# Patient Record
Sex: Female | Born: 1960 | Race: White | Hispanic: No | Marital: Single | Smoking: Current every day smoker
Health system: Southern US, Community
[De-identification: ages and names within clinical notes are randomized; demographics above are authoritative.]

## PROBLEM LIST (undated history)

## (undated) DIAGNOSIS — R569 Unspecified convulsions: Secondary | ICD-10-CM

## (undated) DIAGNOSIS — I10 Essential (primary) hypertension: Secondary | ICD-10-CM

---

## 2019-10-12 ENCOUNTER — Other Ambulatory Visit: Payer: Self-pay

## 2019-10-12 ENCOUNTER — Emergency Department (HOSPITAL_COMMUNITY): Payer: Self-pay

## 2019-10-12 ENCOUNTER — Emergency Department (HOSPITAL_COMMUNITY)
Admission: EM | Admit: 2019-10-12 | Discharge: 2019-10-12 | Disposition: A | Discharge status: Home / Self Care | Payer: Self-pay | Attending: Emergency Medicine | Admitting: Emergency Medicine

## 2019-10-12 ENCOUNTER — Encounter (HOSPITAL_COMMUNITY): Payer: Self-pay

## 2019-10-12 DIAGNOSIS — F17210 Nicotine dependence, cigarettes, uncomplicated: Secondary | ICD-10-CM | POA: Insufficient documentation

## 2019-10-12 DIAGNOSIS — R072 Precordial pain: Secondary | ICD-10-CM | POA: Insufficient documentation

## 2019-10-12 DIAGNOSIS — R0602 Shortness of breath: Secondary | ICD-10-CM | POA: Insufficient documentation

## 2019-10-12 HISTORY — DX: Unspecified convulsions: R56.9

## 2019-10-12 LAB — CBC
HCT: 42.5 % (ref 36.0–46.0)
Hemoglobin: 14.3 g/dL (ref 12.0–15.0)
MCH: 31.1 pg (ref 26.0–34.0)
MCHC: 33.6 g/dL (ref 30.0–36.0)
MCV: 92.4 fL (ref 80.0–100.0)
Platelets: 225 10*3/uL (ref 150–400)
RBC: 4.6 MIL/uL (ref 3.87–5.11)
RDW: 12.7 % (ref 11.5–15.5)
WBC: 6.1 10*3/uL (ref 4.0–10.5)
nRBC: 0 % (ref 0.0–0.2)

## 2019-10-12 LAB — BASIC METABOLIC PANEL
Anion gap: 9 (ref 5–15)
BUN: 19 mg/dL (ref 6–20)
CO2: 25 mmol/L (ref 22–32)
Calcium: 9.6 mg/dL (ref 8.9–10.3)
Chloride: 107 mmol/L (ref 98–111)
Creatinine, Ser: 0.75 mg/dL (ref 0.44–1.00)
GFR calc Af Amer: >60 mL/min (ref >60)
GFR calc non Af Amer: >60 mL/min (ref >60)
Glucose, Bld: 103 mg/dL — ABNORMAL HIGH (ref 70–99)
Potassium: 4.1 mmol/L (ref 3.5–5.1)
Sodium: 141 mmol/L (ref 135–145)

## 2019-10-12 LAB — TROPONIN I (HIGH SENSITIVITY)
Troponin I (High Sensitivity): 3 ng/L (ref <18)
Troponin I (High Sensitivity): 3 ng/L (ref <18)

## 2019-10-12 MED ORDER — SODIUM CHLORIDE 0.9% FLUSH: 3.0000 mL | Freq: Once | INTRAVENOUS | Status: DC

## 2019-10-12 NOTE — ED Provider Notes (Signed)
[facility]-EMERGENCY DEPT Provider Note   CSN: [id number] Arrival date & time: 10/12/19  1743     History Chief Complaint  Patient presents with  . Chest Pain  . Shortness of Breath    Andrea Moses is a 59 y.o. female.  Patient with the onset of substernal chest pain at 5 PM.  With some radiation to left arm tingling in nature.  Resolved completely on its own at 8 PM.  Symptoms 1 present were associated with shortness of breath and the feeling of heart palpitations.  Patient's risk factors include positive tobacco use.  Although not a formal diagnosis of hypertension blood pressure is elevated here today.  No prior history of chest pain.  Any symptoms like this.  Patient's pain was constant from its onset at 5 PM.        Past Medical History:  Diagnosis Date  . Seizures (HCC)     There are no problems to display for this patient.   History reviewed. No pertinent surgical history.   OB History   No obstetric history on file.     Family History  Problem Relation Age of Onset  . Cerebral aneurysm Mother   . Hypertension Father     Social History   Tobacco Use  . Smoking status: Current Every Day Smoker    Packs/day: 0.50    Types: Cigarettes  . Smokeless tobacco: Never Used  Vaping Use  . Vaping Use: Never used  Substance Use Topics  . Alcohol use: Never  . Drug use: Never    Home Medications Prior to Admission medications   Not on File    Allergies    Bee venom  Review of Systems   Review of Systems  Constitutional: Negative for chills and fever.  HENT: Negative for congestion, rhinorrhea and sore throat.   Eyes: Negative for visual disturbance.  Respiratory: Positive for shortness of breath. Negative for cough.   Cardiovascular: Positive for chest pain. Negative for leg swelling.  Gastrointestinal: Negative for abdominal pain, diarrhea, nausea and vomiting.  Genitourinary: Negative for dysuria.  Musculoskeletal: Negative  for back pain and neck pain.  Skin: Negative for rash.  Neurological: Negative for dizziness, light-headedness and headaches.  Hematological: Does not bruise/bleed easily.  Psychiatric/Behavioral: Negative for confusion.    Physical Exam Updated Vital Signs BP (!) 192/95 (BP Location: Right Arm)   Pulse 92   Temp 98.1 F (36.7 C) (Oral)   Resp (!) 21   Ht 1.549 m (5\' 1" )   Wt 83.9 kg   SpO2 100%   BMI 34.96 kg/m   Physical Exam Vitals and nursing note reviewed.  Constitutional:      General: She is not in acute distress.    Appearance: Normal appearance. She is well-developed.  HENT:     Head: Normocephalic and atraumatic.  Eyes:     Extraocular Movements: Extraocular movements intact.     Conjunctiva/sclera: Conjunctivae normal.     Pupils: Pupils are equal, round, and reactive to light.  Cardiovascular:     Rate and Rhythm: Normal rate and regular rhythm.     Heart sounds: No murmur heard.   Pulmonary:     Effort: Pulmonary effort is normal. No respiratory distress.     Breath sounds: Normal breath sounds.  Abdominal:     Palpations: Abdomen is soft.     Tenderness: There is no abdominal tenderness.  Musculoskeletal:        General: No swelling.  Cervical back: Normal range of motion and neck supple.  Skin:    General: Skin is warm and dry.     Capillary Refill: Capillary refill takes less than 2 seconds.  Neurological:     General: No focal deficit present.     Mental Status: She is alert and oriented to person, place, and time.     Cranial Nerves: No cranial nerve deficit.     Sensory: No sensory deficit.     Motor: No weakness.     ED Results / Procedures / Treatments   Labs (all labs ordered are listed, but only abnormal results are displayed) Labs Reviewed  BASIC METABOLIC PANEL - Abnormal; Notable for the following components:      Result Value   Glucose, Bld 103 (*)    All other components within normal limits  CBC  D-DIMER, QUANTITATIVE  (NOT AT [facility])  TROPONIN I (HIGH SENSITIVITY)  TROPONIN I (HIGH SENSITIVITY)    EKG EKG Interpretation  Date/Time:  Saturday October 12 2019 17:51:37 EDT Ventricular Rate:  92 PR Interval:    QRS Duration: 99 QT Interval:  349 QTC Calculation: 432 R Axis:   74 Text Interpretation: Sinus rhythm Biatrial enlargement 12 Lead; Mason-Likar No previous ECGs available Confirmed by Fredia Sorrow [phone]) on 10/12/2019 8:48:12 PM   Radiology DG Chest 2 View  Result Date: 10/12/2019 CLINICAL DATA:  Chest pain.  Chest heaviness.  Shortness of breath. EXAM: CHEST - 2 VIEW COMPARISON:  None. FINDINGS: The cardiomediastinal contours are normal. Aortic atherosclerosis. Pulmonary vasculature is normal. No consolidation, pleural effusion, or pneumothorax. No acute osseous abnormalities are seen. IMPRESSION: No acute findings. Aortic Atherosclerosis (ICD10-I70.0). Electronically Signed   By: Keith Rake M.D.   On: 10/12/2019 18:23    Procedures Procedures (including critical care time)  Medications Ordered in ED Medications  sodium chloride flush (NS) 0.9 % injection 3 mL (has no administration in time range)    ED Course  I have reviewed the triage vital signs and the nursing notes.  Pertinent labs & imaging results that were available during my care of the patient were reviewed by me and considered in my medical decision making (see chart for details).    MDM Rules/Calculators/A&P                         Based on the heart pathway patient's heart score is 3.  Initial troponin normal.  Delta troponin will be done.  Patient now pain-free.  Chest x-ray without acute findings.  EKG without any acute findings.  Because of the complaint of the shortness of breath and the palpitations we will also check D-dimer.  Patient's renal function is normal.  If D-dimer elevated will do CT angio chest.  If no significant change in the second troponin patient probably can have close follow-up with  cardiology.  Ordered D-dimer but was never sent to the lab.  Patient does not want to wait for that.  Patient not tachycardic here not hypoxic.  Just was interested in doing it because of the history of palpitations.  Patient does not want it.  Patient clinically very stable.  Patient's delta troponins without any acute changes.  She will continue baby aspirin a day and follow-up with cardiology and return for any new or worse symptoms.  Final Clinical Impression(s) / ED Diagnoses Final diagnoses:  Precordial pain    Rx / DC Orders ED Discharge Orders    None  Vanetta Mulders, MD 10/12/19 [phone]

## 2019-10-12 NOTE — ED Triage Notes (Signed)
Patient ac/o heaviness of her chest and tingling of the left chest 20 minutes ago. Patient also c/o SOB

## 2019-10-12 NOTE — Discharge Instructions (Addendum)
Return for any new or worse chest pain symptoms.  Continue take a baby aspirin a day.  Make an appointment to follow-up with cardiology.  Today's work-up without evidence of any acute cardiac event.

## 2020-04-02 ENCOUNTER — Emergency Department (HOSPITAL_COMMUNITY)
Admission: EM | Admit: 2020-04-02 | Discharge: 2020-04-03 | Disposition: A | Discharge status: Home / Self Care | Payer: Self-pay | Attending: Emergency Medicine | Admitting: Emergency Medicine

## 2020-04-02 DIAGNOSIS — R202 Paresthesia of skin: Secondary | ICD-10-CM | POA: Insufficient documentation

## 2020-04-02 DIAGNOSIS — F17210 Nicotine dependence, cigarettes, uncomplicated: Secondary | ICD-10-CM | POA: Insufficient documentation

## 2020-04-02 DIAGNOSIS — M25512 Pain in left shoulder: Secondary | ICD-10-CM | POA: Insufficient documentation

## 2020-04-02 DIAGNOSIS — R55 Syncope and collapse: Secondary | ICD-10-CM | POA: Insufficient documentation

## 2020-04-02 DIAGNOSIS — R42 Dizziness and giddiness: Secondary | ICD-10-CM | POA: Insufficient documentation

## 2020-04-02 DIAGNOSIS — Z7982 Long term (current) use of aspirin: Secondary | ICD-10-CM | POA: Insufficient documentation

## 2020-04-02 DIAGNOSIS — R0602 Shortness of breath: Secondary | ICD-10-CM | POA: Insufficient documentation

## 2020-04-02 NOTE — ED Triage Notes (Signed)
Pt bib [facility] after witnessed syncopal episode at approx 2200. [facility] reports pt sat down at her work and started to feel dizzy and diaphoretic right before she passed out. Pt complaining of left sided chest pain, jaw pain, and left shoulder blade pain. A&Ox4 on arrival .

## 2020-04-03 ENCOUNTER — Emergency Department (HOSPITAL_COMMUNITY): Payer: Self-pay

## 2020-04-03 LAB — COMPREHENSIVE METABOLIC PANEL
ALT: 16 U/L (ref 0–44)
AST: 17 U/L (ref 15–41)
Albumin: 3.6 g/dL (ref 3.5–5.0)
Alkaline Phosphatase: 53 U/L (ref 38–126)
Anion gap: 9 (ref 5–15)
BUN: 25 mg/dL — ABNORMAL HIGH (ref 6–20)
CO2: 23 mmol/L (ref 22–32)
Calcium: 9.4 mg/dL (ref 8.9–10.3)
Chloride: 105 mmol/L (ref 98–111)
Creatinine, Ser: 1.03 mg/dL — ABNORMAL HIGH (ref 0.44–1.00)
GFR, Estimated: >60 mL/min (ref >60)
Glucose, Bld: 110 mg/dL — ABNORMAL HIGH (ref 70–99)
Potassium: 3.9 mmol/L (ref 3.5–5.1)
Sodium: 137 mmol/L (ref 135–145)
Total Bilirubin: 0.4 mg/dL (ref 0.3–1.2)
Total Protein: 6 g/dL — ABNORMAL LOW (ref 6.5–8.1)

## 2020-04-03 LAB — TROPONIN I (HIGH SENSITIVITY)
Troponin I (High Sensitivity): 3 ng/L (ref <18)
Troponin I (High Sensitivity): 4 ng/L (ref <18)

## 2020-04-03 LAB — CBC WITH DIFFERENTIAL/PLATELET
Abs Immature Granulocytes: 0.03 10*3/uL (ref 0.00–0.07)
Basophils Absolute: 0 10*3/uL (ref 0.0–0.1)
Basophils Relative: 0 %
Eosinophils Absolute: 0.1 10*3/uL (ref 0.0–0.5)
Eosinophils Relative: 1 %
HCT: 41.3 % (ref 36.0–46.0)
Hemoglobin: 13.4 g/dL (ref 12.0–15.0)
Immature Granulocytes: 0 %
Lymphocytes Relative: 13 %
Lymphs Abs: 1.4 10*3/uL (ref 0.7–4.0)
MCH: 30.7 pg (ref 26.0–34.0)
MCHC: 32.4 g/dL (ref 30.0–36.0)
MCV: 94.5 fL (ref 80.0–100.0)
Monocytes Absolute: 0.6 10*3/uL (ref 0.1–1.0)
Monocytes Relative: 6 %
Neutro Abs: 9 10*3/uL — ABNORMAL HIGH (ref 1.7–7.7)
Neutrophils Relative %: 80 %
Platelets: 223 10*3/uL (ref 150–400)
RBC: 4.37 MIL/uL (ref 3.87–5.11)
RDW: 12.4 % (ref 11.5–15.5)
WBC: 11.2 10*3/uL — ABNORMAL HIGH (ref 4.0–10.5)
nRBC: 0 % (ref 0.0–0.2)

## 2020-04-03 LAB — I-STAT BETA HCG BLOOD, ED (MC, WL, AP ONLY): I-stat hCG, quantitative: <5 m[IU]/mL (ref <5)

## 2020-04-03 LAB — D-DIMER, QUANTITATIVE: D-Dimer, Quant: 0.29 ug/mL-FEU (ref 0.00–0.50)

## 2020-04-03 LAB — MAGNESIUM: Magnesium: 1.8 mg/dL (ref 1.7–2.4)

## 2020-04-03 LAB — CBG MONITORING, ED: Glucose-Capillary: 112 mg/dL — ABNORMAL HIGH (ref 70–99)

## 2020-04-03 MED ORDER — ASPIRIN 81 MG PO CHEW
324.0000 mg | CHEWABLE_TABLET | Freq: Once | ORAL | Status: DC
  Filled 2020-04-03: qty 4

## 2020-04-03 NOTE — ED Provider Notes (Signed)
[facility] EMERGENCY DEPARTMENT Provider Note   CSN: [id number] Arrival date & time: 04/02/20  2351     History Chief Complaint  Patient presents with  . Loss of Consciousness    Andrea Moses is a 59 y.o. female.  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  She also has some tingling left side of her jaw and felt short of breath.  No nausea.  Mild left scapular pain at the same time.  Called EMS.  With them she "felt warm" but was not febrile.  EKG was relatively normal.  Brought here for further evaluation.  Questionable whether or not she actually syncopized.  At this time patient has mild dizziness but is otherwise asymptomatic.  No recent illnesses, fevers or cough.  No recent trauma.  Has a reported history of seizures. History of smoking. Seen here for similar episode (aside from near syncope) a few months ago with a normal workup. No follow up done.    Loss of Consciousness      Past Medical History:  Diagnosis Date  . Seizures (HCC)     There are no problems to display for this patient.   No past surgical history on file.   OB History   No obstetric history on file.     Family History  Problem Relation Age of Onset  . Cerebral aneurysm Mother   . Hypertension Father     Social History   Tobacco Use  . Smoking status: Current Every Day Smoker    Packs/day: 0.50    Types: Cigarettes  . Smokeless tobacco: Never Used  Vaping Use  . Vaping Use: Never used  Substance Use Topics  . Alcohol use: Never  . Drug use: Never    Home Medications Prior to Admission medications   Medication Sig Start Date End Date Taking? Authorizing Provider  aspirin 325 MG tablet Take 325 mg by mouth daily.   Yes [provider]  lisinopril (ZESTRIL) 20 MG tablet Take 20 mg by mouth daily.   Yes [provider]  Multiple  Vitamins-Minerals (ONE-A-DAY WOMENS PO) Take by mouth.   Yes [provider]    Allergies    Bee venom  Review of Systems   Review of Systems  Cardiovascular: Positive for syncope.  All other systems reviewed and are negative.   Physical Exam Updated Vital Signs BP 109/86   Pulse 86   Temp 97.8 F (36.6 C) (Oral)   Resp 19   Ht 5\' 1"  (1.549 m)   Wt 81.6 kg   SpO2 97%   BMI 34.01 kg/m   Physical Exam Vitals and nursing note reviewed.  Constitutional:      Appearance: She is well-developed.  HENT:     Head: Normocephalic and atraumatic.     Nose: No congestion or rhinorrhea.     Mouth/Throat:     Mouth: Mucous membranes are moist.  Eyes:     Pupils: Pupils are equal, round, and reactive to light.  Cardiovascular:     Rate and Rhythm: Normal rate and regular rhythm.  Pulmonary:     Effort: No respiratory distress.     Breath sounds: No stridor.  Abdominal:     General: There is no distension.  Musculoskeletal:        General: No swelling or tenderness. Normal range of motion.     Cervical back: Normal  range of motion.  Skin:    General: Skin is warm and dry.  Neurological:     General: No focal deficit present.     Mental Status: She is alert.     ED Results / Procedures / Treatments   Labs (all labs ordered are listed, but only abnormal results are displayed) Labs Reviewed  COMPREHENSIVE METABOLIC PANEL - Abnormal; Notable for the following components:      Result Value   Glucose, Bld 110 (*)    BUN 25 (*)    Creatinine, Ser 1.03 (*)    Total Protein 6.0 (*)    All other components within normal limits  CBC WITH DIFFERENTIAL/PLATELET - Abnormal; Notable for the following components:   WBC 11.2 (*)    Neutro Abs 9.0 (*)    All other components within normal limits  CBG MONITORING, ED - Abnormal; Notable for the following components:   Glucose-Capillary 112 (*)    All other components within normal limits  MAGNESIUM  D-DIMER, QUANTITATIVE  (NOT AT [facility])  I-STAT BETA HCG BLOOD, ED ([facility], [facility], [facility] ONLY)  TROPONIN I (HIGH SENSITIVITY)  TROPONIN I (HIGH SENSITIVITY)    EKG EKG Interpretation  Date/Time:  Friday April 03 2020 00:00:21 EST Ventricular Rate:  89 PR Interval:    QRS Duration: 101 QT Interval:  352 QTC Calculation: 429 R Axis:   93 Text Interpretation: Sinus rhythm Right atrial enlargement Borderline right axis deviation Confirmed by Marily Memos [phone]) on 04/03/2020 2:07:15 AM   Radiology DG Chest Portable 1 View  Result Date: 04/03/2020 CLINICAL DATA:  Recent syncopal episode EXAM: PORTABLE CHEST 1 VIEW COMPARISON:  None. FINDINGS: Cardiac shadow is within normal limits. Aortic calcifications are noted. The lungs are well aerated without focal infiltrate or sizable effusion. No bony abnormality is noted. IMPRESSION: No acute abnormality noted. Electronically Signed   By: Alcide Clever M.D.   On: 04/03/2020 00:24    Procedures Procedures (including critical care time)  Medications Ordered in ED Medications  aspirin chewable tablet 324 mg (324 mg Oral Not Given 04/03/20 0044)    ED Course  I have reviewed the triage vital signs and the nursing notes.  Pertinent labs & imaging results that were available during my care of the patient were reviewed by me and considered in my medical decision making (see chart for details).    MDM Rules/Calculators/A&P                          eval for ACS/near syncopal episode which are largerly similar.   Work-up unremarkable.  Low suspicion for ACS at this time.  D-dimer negative low suspicion for PE.  Her symptoms are resolved.  Seems consistent with possible vasovagal episode.  She reiterates that she did not pass out.  I discussed that she has some chest pain previously and this time she had some mild discomfort in her shoulder blade and near syncopal episode I suggest that she follow-up with cardiology.  She states that she is from [place] does not have a PCP in  this area so referral for outpatient cardiology was put in.  Final Clinical Impression(s) / ED Diagnoses Final diagnoses:  Near syncope    Rx / DC Orders ED Discharge Orders         Ordered    Ambulatory referral to Cardiology        04/03/20 0310           Cordarrel Stiefel,  Barbara Cower, MD 04/03/20 [phone]

## 2020-04-13 ENCOUNTER — Encounter: Payer: Self-pay | Admitting: General Practice

## 2021-06-19 ENCOUNTER — Emergency Department (HOSPITAL_COMMUNITY): Payer: Self-pay

## 2021-06-19 ENCOUNTER — Other Ambulatory Visit: Payer: Self-pay

## 2021-06-19 ENCOUNTER — Observation Stay (HOSPITAL_COMMUNITY)
Admission: EM | Admit: 2021-06-19 | Discharge: 2021-06-19 | Disposition: A | Discharge status: Home / Self Care | Payer: Self-pay | Attending: Family Medicine | Admitting: Family Medicine

## 2021-06-19 ENCOUNTER — Observation Stay (HOSPITAL_BASED_OUTPATIENT_CLINIC_OR_DEPARTMENT_OTHER): Payer: Self-pay

## 2021-06-19 ENCOUNTER — Encounter (HOSPITAL_COMMUNITY): Payer: Self-pay | Admitting: Emergency Medicine

## 2021-06-19 ENCOUNTER — Observation Stay (HOSPITAL_COMMUNITY): Payer: Self-pay

## 2021-06-19 DIAGNOSIS — H34231 Retinal artery branch occlusion, right eye: Secondary | ICD-10-CM

## 2021-06-19 DIAGNOSIS — H539 Unspecified visual disturbance: Secondary | ICD-10-CM

## 2021-06-19 DIAGNOSIS — F17210 Nicotine dependence, cigarettes, uncomplicated: Secondary | ICD-10-CM | POA: Insufficient documentation

## 2021-06-19 DIAGNOSIS — Z79899 Other long term (current) drug therapy: Secondary | ICD-10-CM | POA: Insufficient documentation

## 2021-06-19 DIAGNOSIS — M316 Other giant cell arteritis: Secondary | ICD-10-CM

## 2021-06-19 DIAGNOSIS — Z7982 Long term (current) use of aspirin: Secondary | ICD-10-CM | POA: Insufficient documentation

## 2021-06-19 DIAGNOSIS — Z20822 Contact with and (suspected) exposure to covid-19: Secondary | ICD-10-CM | POA: Insufficient documentation

## 2021-06-19 DIAGNOSIS — G459 Transient cerebral ischemic attack, unspecified: Secondary | ICD-10-CM

## 2021-06-19 DIAGNOSIS — I10 Essential (primary) hypertension: Secondary | ICD-10-CM | POA: Diagnosis present

## 2021-06-19 HISTORY — DX: Essential (primary) hypertension: I10

## 2021-06-19 LAB — URINALYSIS, ROUTINE W REFLEX MICROSCOPIC
Bacteria, UA: NONE SEEN
Bilirubin Urine: NEGATIVE
Glucose, UA: NEGATIVE mg/dL
Hgb urine dipstick: NEGATIVE
Ketones, ur: NEGATIVE mg/dL
Nitrite: NEGATIVE
Protein, ur: NEGATIVE mg/dL
Specific Gravity, Urine: 1.016 (ref 1.005–1.030)
pH: 7 (ref 5.0–8.0)

## 2021-06-19 LAB — ECHOCARDIOGRAM COMPLETE
AR max vel: 1.73 cm2
AV Area VTI: 1.87 cm2
AV Area mean vel: 1.71 cm2
AV Mean grad: 4 mmHg
AV Peak grad: 7 mmHg
Ao pk vel: 1.32 m/s
Area-P 1/2: 3.48 cm2
Calc EF: 59.9 %
S' Lateral: 2.3 cm
Single Plane A2C EF: 68.5 %
Single Plane A4C EF: 53.5 %

## 2021-06-19 LAB — COMPREHENSIVE METABOLIC PANEL
ALT: 16 U/L (ref 0–44)
AST: 19 U/L (ref 15–41)
Albumin: 3.8 g/dL (ref 3.5–5.0)
Alkaline Phosphatase: 55 U/L (ref 38–126)
Anion gap: 8 (ref 5–15)
BUN: 12 mg/dL (ref 8–23)
CO2: 26 mmol/L (ref 22–32)
Calcium: 9.5 mg/dL (ref 8.9–10.3)
Chloride: 106 mmol/L (ref 98–111)
Creatinine, Ser: 0.78 mg/dL (ref 0.44–1.00)
GFR, Estimated: >60 mL/min (ref >60)
Glucose, Bld: 89 mg/dL (ref 70–99)
Potassium: 4.1 mmol/L (ref 3.5–5.1)
Sodium: 140 mmol/L (ref 135–145)
Total Bilirubin: 0.3 mg/dL (ref 0.3–1.2)
Total Protein: 6.2 g/dL — ABNORMAL LOW (ref 6.5–8.1)

## 2021-06-19 LAB — CBC
HCT: 39.5 % (ref 36.0–46.0)
HCT: 36.5 % (ref 36.0–46.0)
Hemoglobin: 13.3 g/dL (ref 12.0–15.0)
Hemoglobin: 12.3 g/dL (ref 12.0–15.0)
MCH: 32 pg (ref 26.0–34.0)
MCH: 31.8 pg (ref 26.0–34.0)
MCHC: 33.7 g/dL (ref 30.0–36.0)
MCHC: 33.7 g/dL (ref 30.0–36.0)
MCV: 95 fL (ref 80.0–100.0)
MCV: 94.3 fL (ref 80.0–100.0)
Platelets: 204 10*3/uL (ref 150–400)
Platelets: 191 10*3/uL (ref 150–400)
RBC: 4.16 MIL/uL (ref 3.87–5.11)
RBC: 3.87 MIL/uL (ref 3.87–5.11)
RDW: 12.3 % (ref 11.5–15.5)
RDW: 12.3 % (ref 11.5–15.5)
WBC: 6.1 10*3/uL (ref 4.0–10.5)
WBC: 5.2 10*3/uL (ref 4.0–10.5)
nRBC: 0 % (ref 0.0–0.2)
nRBC: 0 % (ref 0.0–0.2)

## 2021-06-19 LAB — RAPID URINE DRUG SCREEN, HOSP PERFORMED
Amphetamines: NOT DETECTED
Barbiturates: NOT DETECTED
Benzodiazepines: NOT DETECTED
Cocaine: NOT DETECTED
Opiates: NOT DETECTED
Tetrahydrocannabinol: NOT DETECTED

## 2021-06-19 LAB — I-STAT CHEM 8, ED
BUN: 13 mg/dL (ref 8–23)
Calcium, Ion: 1.26 mmol/L (ref 1.15–1.40)
Chloride: 106 mmol/L (ref 98–111)
Creatinine, Ser: 0.7 mg/dL (ref 0.44–1.00)
Glucose, Bld: 83 mg/dL (ref 70–99)
HCT: 39 % (ref 36.0–46.0)
Hemoglobin: 13.3 g/dL (ref 12.0–15.0)
Potassium: 4.2 mmol/L (ref 3.5–5.1)
Sodium: 143 mmol/L (ref 135–145)
TCO2: 29 mmol/L (ref 22–32)

## 2021-06-19 LAB — PROTIME-INR
INR: 0.9 (ref 0.8–1.2)
Prothrombin Time: 12.1 seconds (ref 11.4–15.2)

## 2021-06-19 LAB — DIFFERENTIAL
Abs Immature Granulocytes: 0.01 10*3/uL (ref 0.00–0.07)
Basophils Absolute: 0 10*3/uL (ref 0.0–0.1)
Basophils Relative: 0 %
Eosinophils Absolute: 0.2 10*3/uL (ref 0.0–0.5)
Eosinophils Relative: 3 %
Immature Granulocytes: 0 %
Lymphocytes Relative: 39 %
Lymphs Abs: 2.4 10*3/uL (ref 0.7–4.0)
Monocytes Absolute: 0.4 10*3/uL (ref 0.1–1.0)
Monocytes Relative: 7 %
Neutro Abs: 3.1 10*3/uL (ref 1.7–7.7)
Neutrophils Relative %: 51 %

## 2021-06-19 LAB — LIPID PANEL
Cholesterol: 226 mg/dL — ABNORMAL HIGH (ref 0–200)
HDL: 55 mg/dL (ref >40)
LDL Cholesterol: 161 mg/dL — ABNORMAL HIGH (ref 0–99)
Total CHOL/HDL Ratio: 4.1 RATIO
Triglycerides: 49 mg/dL (ref <150)
VLDL: 10 mg/dL (ref 0–40)

## 2021-06-19 LAB — RESP PANEL BY RT-PCR (FLU A&B, COVID) ARPGX2
Influenza A by PCR: NEGATIVE
Influenza B by PCR: NEGATIVE
SARS Coronavirus 2 by RT PCR: NEGATIVE

## 2021-06-19 LAB — CBG MONITORING, ED: Glucose-Capillary: 91 mg/dL (ref 70–99)

## 2021-06-19 LAB — HIV ANTIBODY (ROUTINE TESTING W REFLEX): HIV Screen 4th Generation wRfx: NONREACTIVE

## 2021-06-19 LAB — APTT: aPTT: 33 seconds (ref 24–36)

## 2021-06-19 LAB — ETHANOL: Alcohol, Ethyl (B): <10 mg/dL (ref <10)

## 2021-06-19 LAB — CREATININE, SERUM
Creatinine, Ser: 0.77 mg/dL (ref 0.44–1.00)
GFR, Estimated: >60 mL/min (ref >60)

## 2021-06-19 LAB — HEMOGLOBIN A1C
Hgb A1c MFr Bld: 4.8 % (ref 4.8–5.6)
Mean Plasma Glucose: 91.06 mg/dL

## 2021-06-19 LAB — C-REACTIVE PROTEIN: CRP: 0.6 mg/dL (ref <1.0)

## 2021-06-19 LAB — SEDIMENTATION RATE: Sed Rate: 10 mm/hr (ref 0–22)

## 2021-06-19 MED ORDER — ASPIRIN 81 MG PO CHEW
81.0000 mg | CHEWABLE_TABLET | Freq: Every day | ORAL | Status: DC
  Administered 2021-06-19: 81 mg via ORAL
  Filled 2021-06-19: qty 1

## 2021-06-19 MED ORDER — ACETAMINOPHEN 160 MG/5ML PO SOLN: 650.0000 mg | ORAL | Status: DC | PRN

## 2021-06-19 MED ORDER — ASPIRIN 81 MG PO TBEC: 81.0000 mg | DELAYED_RELEASE_TABLET | Freq: Every day | ORAL | 0 refills | Status: AC

## 2021-06-19 MED ORDER — STROKE: EARLY STAGES OF RECOVERY BOOK
Freq: Once | Status: AC
  Filled 2021-06-19: qty 1

## 2021-06-19 MED ORDER — ATORVASTATIN CALCIUM 80 MG PO TABS
80.0000 mg | ORAL_TABLET | Freq: Every day | ORAL | 1 refills | Status: AC
Start: 2021-06-20 — End: ?

## 2021-06-19 MED ORDER — CLOPIDOGREL BISULFATE 75 MG PO TABS: 75.0000 mg | ORAL_TABLET | Freq: Every day | ORAL | 1 refills | Status: AC

## 2021-06-19 MED ORDER — CLOPIDOGREL BISULFATE 75 MG PO TABS
75.0000 mg | ORAL_TABLET | Freq: Every day | ORAL | Status: DC
Start: 2021-06-19 — End: 2021-06-19
  Administered 2021-06-19: 75 mg via ORAL
  Filled 2021-06-19: qty 1

## 2021-06-19 MED ORDER — SODIUM CHLORIDE 0.9 % IV SOLN: INTRAVENOUS | Status: DC

## 2021-06-19 MED ORDER — HYDRALAZINE HCL 20 MG/ML IJ SOLN
10.0000 mg | INTRAMUSCULAR | Status: DC | PRN
Start: 2021-06-19 — End: 2021-06-19

## 2021-06-19 MED ORDER — ATORVASTATIN CALCIUM 80 MG PO TABS
80.0000 mg | ORAL_TABLET | Freq: Every day | ORAL | Status: DC
  Administered 2021-06-19: 80 mg via ORAL
  Filled 2021-06-19: qty 1

## 2021-06-19 MED ORDER — ACETAMINOPHEN 325 MG PO TABS: 650.0000 mg | ORAL_TABLET | ORAL | Status: DC | PRN

## 2021-06-19 MED ORDER — ENOXAPARIN SODIUM 40 MG/0.4ML IJ SOSY
40.0000 mg | PREFILLED_SYRINGE | INTRAMUSCULAR | Status: DC
  Administered 2021-06-19: 40 mg via SUBCUTANEOUS
  Filled 2021-06-19: qty 0.4

## 2021-06-19 MED ORDER — ACETAMINOPHEN 650 MG RE SUPP: 650.0000 mg | RECTAL | Status: DC | PRN

## 2021-06-19 MED ORDER — IOHEXOL 350 MG/ML SOLN
75.0000 mL | Freq: Once | INTRAVENOUS | Status: AC | PRN
  Administered 2021-06-19: 75 mL via INTRAVENOUS

## 2021-06-19 NOTE — ED Notes (Signed)
Neuro MD at BS

## 2021-06-19 NOTE — Progress Notes (Incomplete)
° °  Echocardiogram 2D Echocardiogram has been performed.  [name] 06/19/2021, 12:30 PM

## 2021-06-19 NOTE — Progress Notes (Signed)
° °  Echocardiogram 2D Echocardiogram has been performed.  [name] 06/19/2021, 12:31 PM

## 2021-06-19 NOTE — ED Triage Notes (Addendum)
Patient with headache, nausea and vomiting.  Patient started with numbness on left side.  Patient states she lost vision on the right side, but it is back now. Patient has equal hand grips, weak pedal pushes.  No slurred speech.  Patient does not feel right.  LSN:  [id number] 2/17

## 2021-06-19 NOTE — Code Documentation (Signed)
Responded to Code Stroke called at [facility] on pt already in ED for vision changes, [id number]. NIH-1 for R quadrantanopia, CT head neg for acute changes. TNK not given-risks outweigh benefits per pt. Plan to admit for stroke work-up.

## 2021-06-19 NOTE — ED Notes (Addendum)
Ambulatory to  b/r, steady gait. PT here to see.

## 2021-06-19 NOTE — H&P (Signed)
History and Physical    Andrea Moses [id number] DOB: [birth year] DOA: 06/19/2021  PCP: Patient, No Pcp Per (Inactive)  Patient coming from: Home.  Chief Complaint: Right-sided vision loss.  HPI: Andrea Moses is a 61 y.o. female with history of hypertension, tobacco abuse started experiencing right-sided headache at around 8 PM last night.  The pain was constant and at around midnight patient started sudden onset of loss of vision of the right which lasted 10 minutes and started regaining vision partially but still had loss of vision peripherally on the right eye.  Patient's daughter plan to bring patient to the ER but when tried to ambulate her she was not able to balance herself well felt that she was weak on the right side.  ED Course: In the ER patient was evaluated by the neurologist.  Patient was noticed to have loss of vision on the right eye peripheral vision.  Was able to move all extremities.  CT head followed by CT angiogram of the head and neck was done which did not show any large vessel occlusion.  But there was some concern for possible right-sided vocal cord paralysis.  At this time patient will be admitted for further work-up for possible stroke versus temporal arteritis versus complex migraine.  At time of my exam patient states she has regained almost complete vision of the right eye.  Labs are largely unremarkable EKG shows normal sinus rhythm COVID test is negative.  Review of Systems: As per HPI, rest all negative.   Past Medical History:  Diagnosis Date   Hypertension    Seizures ([place])     History reviewed. No pertinent surgical history.   reports that she has been smoking cigarettes. She has been smoking an average of .5 packs per day. She has never used smokeless tobacco. She reports that she does not drink alcohol and does not use drugs.  Allergies  Allergen Reactions   Bee Venom     Family History  Problem Relation Age of Onset   Cerebral aneurysm Mother     Hypertension Father     Prior to Admission medications   Medication Sig Start Date End Date Taking? Authorizing Provider  aspirin 81 MG EC tablet Take 81 mg by mouth daily.   Yes [provider]  ibuprofen (ADVIL) 200 MG tablet Take 600 mg by mouth every 6 (six) hours as needed for headache or moderate pain.   Yes [provider]  lisinopril (ZESTRIL) 20 MG tablet Take 20 mg by mouth daily.   Yes [provider]  Multiple Vitamins-Minerals (ONE-A-DAY WOMENS PO) Take 1 tablet by mouth daily.   Yes [provider]    Physical Exam: Constitutional: Moderately built and nourished. Vitals:   06/19/21 0230 06/19/21 0245 06/19/21 0300 06/19/21 0315  BP: 127/80 (!) 143/74 135/65 (!) 143/66  Pulse: 64 63 64 62  Resp: 17 18 18 15   Temp:      TempSrc:      SpO2: 98% 98% 98% 98%   Eyes: Anicteric no pallor. ENMT: No discharge from the ears eyes nose and mouth. Neck: No mass felt.  No neck rigidity. Respiratory: No rhonchi or crepitations. Cardiovascular: S1-S2 heard. Abdomen: Soft nontender bowel sound present. Musculoskeletal: No edema. Skin: No rash. Neurologic: Alert awake oriented time place and person.  Moves all extremities 5 x 5.  No facial asymmetry tongue is midline pupils equal and reacting to light. Psychiatric: Appears normal.  Normal affect.   Labs on Admission: I  have personally reviewed following labs and imaging studies  CBC: Recent Labs  Lab 06/19/21 0125 06/19/21 0138  WBC 6.1  --   NEUTROABS 3.1  --   HGB 13.3 13.3  HCT 39.5 39.0  MCV 95.0  --   PLT 204  --    Basic Metabolic Panel: Recent Labs  Lab 06/19/21 0125 06/19/21 0138  NA 140 143  K 4.1 4.2  CL 106 106  CO2 26  --   GLUCOSE 89 83  BUN 12 13  CREATININE 0.78 0.70  CALCIUM 9.5  --    GFR: CrCl cannot be calculated (Unknown ideal weight.). Liver Function Tests: Recent Labs  Lab 06/19/21 0125  AST 19  ALT 16  ALKPHOS 55  BILITOT 0.3  PROT 6.2*   ALBUMIN 3.8   No results for input(s): LIPASE, AMYLASE in the last 168 hours. No results for input(s): AMMONIA in the last 168 hours. Coagulation Profile: Recent Labs  Lab 06/19/21 0125  INR 0.9   Cardiac Enzymes: No results for input(s): CKTOTAL, CKMB, CKMBINDEX, TROPONINI in the last 168 hours. BNP (last 3 results) No results for input(s): PROBNP in the last 8760 hours. HbA1C: No results for input(s): HGBA1C in the last 72 hours. CBG: Recent Labs  Lab 06/19/21 0104  GLUCAP 91   Lipid Profile: No results for input(s): CHOL, HDL, LDLCALC, TRIG, CHOLHDL, LDLDIRECT in the last 72 hours. Thyroid Function Tests: No results for input(s): TSH, T4TOTAL, FREET4, T3FREE, THYROIDAB in the last 72 hours. Anemia Panel: No results for input(s): VITAMINB12, FOLATE, FERRITIN, TIBC, IRON, RETICCTPCT in the last 72 hours. Urine analysis:    Component Value Date/Time   COLORURINE YELLOW 06/19/2021 0406   APPEARANCEUR CLEAR 06/19/2021 0406   LABSPEC 1.016 06/19/2021 0406   PHURINE 7.0 06/19/2021 0406   GLUCOSEU NEGATIVE 06/19/2021 0406   HGBUR NEGATIVE 06/19/2021 0406   BILIRUBINUR NEGATIVE 06/19/2021 0406   KETONESUR NEGATIVE 06/19/2021 0406   PROTEINUR NEGATIVE 06/19/2021 0406   NITRITE NEGATIVE 06/19/2021 0406   LEUKOCYTESUR LARGE (A) 06/19/2021 0406   Sepsis Labs: @LABRCNTIP (procalcitonin:4,lacticidven:4) ) Recent Results (from the past 240 hour(s))  Resp Panel by RT-PCR (Flu A&B, Covid) Nasopharyngeal Swab     Status: None   Collection Time: 06/19/21  1:12 AM   Specimen: Nasopharyngeal Swab; Nasopharyngeal(NP) swabs in vial transport medium  Result Value Ref Range Status   SARS Coronavirus 2 by RT PCR NEGATIVE NEGATIVE Final    Comment: (NOTE) SARS-CoV-2 target nucleic acids are NOT DETECTED.  The SARS-CoV-2 RNA is generally detectable in upper respiratory specimens during the acute phase of infection. The lowest concentration of SARS-CoV-2 viral copies this assay can  detect is 138 copies/mL. A negative result does not preclude SARS-Cov-2 infection and should not be used as the sole basis for treatment or other patient management decisions. A negative result may occur with  improper specimen collection/handling, submission of specimen other than nasopharyngeal swab, presence of viral mutation(s) within the areas targeted by this assay, and inadequate number of viral copies(<138 copies/mL). A negative result must be combined with clinical observations, patient history, and epidemiological information. The expected result is Negative.  Fact Sheet for Patients:  EntrepreneurPulse.com.au  Fact Sheet for Healthcare Providers:  IncredibleEmployment.be  This test is no t yet approved or cleared by the [place] FDA and  has been authorized for detection and/or diagnosis of SARS-CoV-2 by FDA under an Emergency Use Authorization (EUA). This EUA will remain  in effect (meaning this test can be used)  for the duration of the COVID-19 declaration under Section 564(b)(1) of the Act, 21 U.S.C.section 360bbb-3(b)(1), unless the authorization is terminated  or revoked sooner.       Influenza A by PCR NEGATIVE NEGATIVE Final   Influenza B by PCR NEGATIVE NEGATIVE Final    Comment: (NOTE) The Xpert Xpress SARS-CoV-2/FLU/RSV plus assay is intended as an aid in the diagnosis of influenza from Nasopharyngeal swab specimens and should not be used as a sole basis for treatment. Nasal washings and aspirates are unacceptable for Xpert Xpress SARS-CoV-2/FLU/RSV testing.  Fact Sheet for Patients: EntrepreneurPulse.com.au  Fact Sheet for Healthcare Providers: IncredibleEmployment.be  This test is not yet approved or cleared by the [place] FDA and has been authorized for detection and/or diagnosis of SARS-CoV-2 by FDA under an Emergency Use Authorization (EUA). This EUA will remain in  effect (meaning this test can be used) for the duration of the COVID-19 declaration under Section 564(b)(1) of the Act, 21 U.S.C. section 360bbb-3(b)(1), unless the authorization is terminated or revoked.  Performed at [facility], [place] [address] [place], [place] [address]      Radiological Exams on Admission: CT HEAD CODE STROKE WO CONTRAST  Result Date: 06/19/2021 CLINICAL DATA:  Code stroke. EXAM: CT HEAD WITHOUT CONTRAST TECHNIQUE: Contiguous axial images were obtained from the base of the skull through the vertex without intravenous contrast. RADIATION DOSE REDUCTION: This exam was performed according to the departmental dose-optimization program which includes automated exposure control, adjustment of the mA and/or kV according to patient size and/or use of iterative reconstruction technique. COMPARISON:  None. FINDINGS: Brain: Cerebral volume within normal limits for patient age. No evidence for acute intracranial hemorrhage. No findings to suggest acute large vessel territory infarct. No mass lesion, midline shift, or mass effect. Ventricles are normal in size without evidence for hydrocephalus. No extra-axial fluid collection identified. Vascular: No hyperdense vessel identified. Skull: Scalp soft tissues demonstrate no acute abnormality. Calvarium intact. Sinuses/Orbits: Globes and orbital soft tissues within normal limits. Visualized paranasal sinuses are clear. No mastoid effusion. ASPECTS [facility] Stroke Program Early CT Score) - Ganglionic level infarction (caudate, lentiform nuclei, internal capsule, insula, M1-M3 cortex): 7 - Supraganglionic infarction (M4-M6 cortex): 3 Total score (0-10 with 10 being normal): 10 IMPRESSION: 1. Negative head CT.  No acute intracranial abnormality. 2. ASPECTS is 10. These results were communicated to Dr. Lorrin Goodell at 1:40 am on 06/19/2021 by text page via the [facility] messaging system. Electronically Signed   By: Jeannine Boga M.D.   On:  06/19/2021 01:41   CT ANGIO HEAD NECK W WO CM (CODE STROKE)  Result Date: 06/19/2021 CLINICAL DATA:  Initial evaluation for neuro deficit, stroke. EXAM: CT ANGIOGRAPHY HEAD AND NECK TECHNIQUE: Multidetector CT imaging of the head and neck was performed using the standard protocol during bolus administration of intravenous contrast. Multiplanar CT image reconstructions and MIPs were obtained to evaluate the vascular anatomy. Carotid stenosis measurements (when applicable) are obtained utilizing NASCET criteria, using the distal internal carotid diameter as the denominator. RADIATION DOSE REDUCTION: This exam was performed according to the departmental dose-optimization program which includes automated exposure control, adjustment of the mA and/or kV according to patient size and/or use of iterative reconstruction technique. CONTRAST:  100 cc of Isovue 370. COMPARISON:  Head CT from earlier the same day. FINDINGS: CTA NECK FINDINGS Aortic arch: Visualized aortic arch normal caliber with normal branch pattern. Mild plaque about the arch and origin the great vessels without significant stenosis. Right carotid system:  Right common and internal carotid arteries patent without stenosis or dissection. Mild atheromatous change about the right carotid bulb without significant stenosis. Left carotid system: Left common and internal carotid arteries patent without stenosis or dissection. Mild atheromatous change about the left carotid bulb/proximal left ICA without significant stenosis. Vertebral arteries: Both vertebral arteries arise from the subclavian arteries. No proximal subclavian artery stenosis. Both vertebral arteries widely patent without stenosis, dissection or occlusion. Skeleton: 2.3 cm expansile cystic lesion seen involving the left paramedian maxilla (series 5, image 101), indeterminate, but could reflect a periapical/radicular cyst versus odontogenic keratocyst. Patient is edentulous. No other discrete or  worrisome osseous lesions. Other neck: 8 mm left thyroid nodule noted, of doubtful significance given size and patient age, no follow-up imaging recommended (ref: J Am Coll Radiol. 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 No other acute soft tissue abnormality within the neck. No other acute soft tissue abnormality within the neck. Upper chest: Visualized upper chest demonstrates no acute finding. Review of the MIP images confirms the above findings CTA HEAD FINDINGS Anterior circulation: Petrous segments patent bilaterally. Mild atheromatous change within the carotid siphons without hemodynamically significant stenosis. A1 segments patent bilaterally. Normal anterior communicating artery complex. Anterior cerebral arteries patent without stenosis. No M1 stenosis or occlusion. Normal MCA bifurcations. Distal MCA branches perfused and symmetric. Posterior circulation: Mild atheromatous irregularity within the V4 segments bilaterally without significant stenosis. Right PICA patent. Left PICA not seen. Basilar patent to its distal aspect without stenosis. Superior cerebellar and posterior cerebral arteries patent bilaterally. Venous sinuses: Patent allowing for timing the contrast bolus. Anatomic variants: None significant.  No aneurysm. Review of the MIP images confirms the above findings IMPRESSION: 1. Negative CTA for large vessel occlusion. 2. Mild atheromatous change about the carotid bifurcations and carotid siphons without hemodynamically significant stenosis. 3. 2.3 cm expansile cystic lesion involving the left paramedian maxilla, indeterminate. Primary differential considerations include a periapical/radicular cyst versus odontogenic keratocyst. 4. 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 Correlation with physical exam. These results were communicated to Dr. Lorrin Goodell at 2:02 am on 06/19/2021 by text page via the [facility] messaging  system. Electronically Signed   By: Jeannine Boga M.D.   On: 06/19/2021 02:10    EKG: Independently reviewed.  Normal sinus rhythm.  Assessment/Plan Principal Problem:   TIA (transient ischemic attack) Active Problems:   Essential hypertension    Possible stroke versus complex migraine versus temporal arteritis -discussed with neurologist.  [place] neurology consult.  MRI brain is pending.  Patient did pass stroke swallow.  We will keep patient on neurochecks patient is on aspirin Plavix check hemoglobin A1c lipid panel 2D echo physical therapy consult and in addition we will be checking sed rate and CRP to rule out temporal arteritis. 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  Will await MRI brain.  Check chest x-ray. Hypertension allow for permissive hypertension given the possibility of stroke..  IV hydralazine for systolic pressure more than [id number] and diastolic more than [id number]. Tobacco abuse advised about quitting.   DVT prophylaxis: Lovenox. Code Status: Full code. Family Communication: Patient's daughter at the bedside. Disposition Plan: Home. Consults called: Neurology. Admission status: Observation.   Rise Patience MD [facility] Pager [phone].  If 7PM-7AM, please contact night-coverage [email or web address] Password [facility]  06/19/2021, 4:29 AM

## 2021-06-19 NOTE — ED Notes (Signed)
PT finished with pt. Pt to MRI.

## 2021-06-19 NOTE — Progress Notes (Addendum)
STROKE TEAM PROGRESS NOTE   INTERVAL HISTORY Patient is seen in her room with no family at the bedside.  Yesterday, she awoke at 2330 with a headache, right sided numbness and loss of vision in her right eye.  These symptoms did improve until she had only residual visual deficits in the right upper quadrant of her right eye.  She is able to perceive motion there but is unable to count fingers shown in that quadrant.  Vitals:   06/19/21 0815 06/19/21 0845 06/19/21 0900 06/19/21 1115  BP: 106/70 124/69 137/73 136/68  Pulse: (!) 59 61 71 62  Resp: 17 17 20    Temp:      TempSrc:      SpO2: 98% 100% 97% 99%   CBC:  Recent Labs  Lab 06/19/21 0125 06/19/21 0138 06/19/21 0513  WBC 6.1  --  5.2  NEUTROABS 3.1  --   --   HGB 13.3 13.3 12.3  HCT 39.5 39.0 36.5  MCV 95.0  --  94.3  PLT 204  --  191   Basic Metabolic Panel:  Recent Labs  Lab 06/19/21 0125 06/19/21 0138 06/19/21 0513  NA 140 143  --   K 4.1 4.2  --   CL 106 106  --   CO2 26  --   --   GLUCOSE 89 83  --   BUN 12 13  --   CREATININE 0.78 0.70 0.77  CALCIUM 9.5  --   --    Lipid Panel:  Recent Labs  Lab 06/19/21 0513  CHOL 226*  TRIG 49  HDL 55  CHOLHDL 4.1  VLDL 10  LDLCALC 06/21/21*   HgbA1c:  Recent Labs  Lab 06/19/21 0513  HGBA1C 4.8   Urine Drug Screen:  Recent Labs  Lab 06/19/21 0406  LABOPIA NONE DETECTED  COCAINSCRNUR NONE DETECTED  LABBENZ NONE DETECTED  AMPHETMU NONE DETECTED  THCU NONE DETECTED  LABBARB NONE DETECTED    Alcohol Level  Recent Labs  Lab 06/19/21 0125  ETH <10    IMAGING past 24 hours MR BRAIN WO CONTRAST  Result Date: 06/19/2021 CLINICAL DATA:  61 year old female code stroke presentation. Right side amaurosis fugax. EXAM: MRI HEAD WITHOUT CONTRAST TECHNIQUE: Multiplanar, multiecho pulse sequences of the brain and surrounding structures were obtained without intravenous contrast. COMPARISON:  CT head, CTA head and neck earlier today. FINDINGS: Unknown artifact leading  to generalized signal loss along the anterior head throughout this exam. Brain: No restricted diffusion to suggest acute infarction. No midline shift, mass effect, evidence of mass lesion, ventriculomegaly, extra-axial collection or acute intracranial hemorrhage. Cervicomedullary junction and pituitary are within normal limits. Cerebral volume is within normal limits for age. 77 and white matter signal is within normal limits for age throughout the brain. No cortical encephalomalacia or chronic cerebral blood products identified. Vascular: Major intracranial vascular flow voids are preserved. Skull and upper cervical spine: Negative visible cervical spine. Visualized bone marrow signal is within normal limits. Sinuses/Orbits: Suprasellar cistern and optic chiasm appear within normal limits. Grossly symmetric and negative orbits soft tissues. Paranasal Visualized paranasal sinuses and mastoids are stable and well aerated. Other: Visible internal auditory structures appear normal. Absent dentition. Prominent but circumscribed lucent lesion of the left maxillary alveolus as discussed on CTA earlier today is T2 hyperintense with no obvious diffusion changes. IMPRESSION: 1. Normal for age noncontrast MRI appearance of the brain. 2. Cystic lesion of the left maxillary alveolus as detailed on CTA today. Electronically Signed  By: Odessa FlemingH  Hall M.D.   On: 06/19/2021 10:09   CT HEAD CODE STROKE WO CONTRAST  Result Date: 06/19/2021 CLINICAL DATA:  Code stroke. EXAM: CT HEAD WITHOUT CONTRAST TECHNIQUE: Contiguous axial images were obtained from the base of the skull through the vertex without intravenous contrast. RADIATION DOSE REDUCTION: This exam was performed according to the departmental dose-optimization program which includes automated exposure control, adjustment of the mA and/or kV according to patient size and/or use of iterative reconstruction technique. COMPARISON:  None. FINDINGS: Brain: Cerebral volume within  normal limits for patient age. No evidence for acute intracranial hemorrhage. No findings to suggest acute large vessel territory infarct. No mass lesion, midline shift, or mass effect. Ventricles are normal in size without evidence for hydrocephalus. No extra-axial fluid collection identified. Vascular: No hyperdense vessel identified. Skull: Scalp soft tissues demonstrate no acute abnormality. Calvarium intact. Sinuses/Orbits: Globes and orbital soft tissues within normal limits. Visualized paranasal sinuses are clear. No mastoid effusion. ASPECTS [facility]([place] Stroke Program Early CT Score) - Ganglionic level infarction (caudate, lentiform nuclei, internal capsule, insula, M1-M3 cortex): 7 - Supraganglionic infarction (M4-M6 cortex): 3 Total score (0-10 with 10 being normal): 10 IMPRESSION: 1. Negative head CT.  No acute intracranial abnormality. 2. ASPECTS is 10. These results were communicated to Dr. Derry LoryKhaliqdina at 1:40 am on 06/19/2021 by text page via the [facility] messaging system. Electronically Signed   By: Rise MuBenjamin  McClintock M.D.   On: 06/19/2021 01:41   CT ANGIO HEAD NECK W WO CM (CODE STROKE)  Result Date: 06/19/2021 CLINICAL DATA:  Initial evaluation for neuro deficit, stroke. EXAM: CT ANGIOGRAPHY HEAD AND NECK TECHNIQUE: Multidetector CT imaging of the head and neck was performed using the standard protocol during bolus administration of intravenous contrast. Multiplanar CT image reconstructions and MIPs were obtained to evaluate the vascular anatomy. Carotid stenosis measurements (when applicable) are obtained utilizing NASCET criteria, using the distal internal carotid diameter as the denominator. RADIATION DOSE REDUCTION: This exam was performed according to the departmental dose-optimization program which includes automated exposure control, adjustment of the mA and/or kV according to patient size and/or use of iterative reconstruction technique. CONTRAST:  100 cc of Isovue 370. COMPARISON:  Head CT  from earlier the same day. FINDINGS: CTA NECK FINDINGS Aortic arch: Visualized aortic arch normal caliber with normal branch pattern. Mild plaque about the arch and origin the great vessels without significant stenosis. Right carotid system: Right common and internal carotid arteries patent without stenosis or dissection. Mild atheromatous change about the right carotid bulb without significant stenosis. Left carotid system: Left common and internal carotid arteries patent without stenosis or dissection. Mild atheromatous change about the left carotid bulb/proximal left ICA without significant stenosis. Vertebral arteries: Both vertebral arteries arise from the subclavian arteries. No proximal subclavian artery stenosis. Both vertebral arteries widely patent without stenosis, dissection or occlusion. Skeleton: 2.3 cm expansile cystic lesion seen involving the left paramedian maxilla (series 5, image 101), indeterminate, but could reflect a periapical/radicular cyst versus odontogenic keratocyst. Patient is edentulous. No other discrete or worrisome osseous lesions. Other neck: 8 mm left thyroid nodule noted, of doubtful significance given size and patient age, no follow-up imaging recommended (ref: J Am Coll Radiol. 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 No other acute soft tissue abnormality within the neck. No other acute soft tissue abnormality within the neck. Upper chest: Visualized upper chest demonstrates no acute finding. Review of the MIP images confirms the above findings CTA HEAD  FINDINGS Anterior circulation: Petrous segments patent bilaterally. Mild atheromatous change within the carotid siphons without hemodynamically significant stenosis. A1 segments patent bilaterally. Normal anterior communicating artery complex. Anterior cerebral arteries patent without stenosis. No M1 stenosis or occlusion. Normal MCA bifurcations. Distal MCA branches  perfused and symmetric. Posterior circulation: Mild atheromatous irregularity within the V4 segments bilaterally without significant stenosis. Right PICA patent. Left PICA not seen. Basilar patent to its distal aspect without stenosis. Superior cerebellar and posterior cerebral arteries patent bilaterally. Venous sinuses: Patent allowing for timing the contrast bolus. Anatomic variants: None significant.  No aneurysm. Review of the MIP images confirms the above findings IMPRESSION: 1. Negative CTA for large vessel occlusion. 2. Mild atheromatous change about the carotid bifurcations and carotid siphons without hemodynamically significant stenosis. 3. 2.3 cm expansile cystic lesion involving the left paramedian maxilla, indeterminate. Primary differential considerations include a periapical/radicular cyst versus odontogenic keratocyst. 4. 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 Correlation with physical exam. These results were communicated to Dr. Derry Lory at 2:02 am on 06/19/2021 by text page via the [facility] messaging system. Electronically Signed   By: Rise Mu M.D.   On: 06/19/2021 02:10    PHYSICAL EXAM General:  Alert, well-developed, well-nourished patient in no acute distress   NEURO:  Mental Status: AA&Ox3  Speech/Language: speech is without dysarthria or aphasia.    Cranial Nerves:  II: PERRL. Visual deficit in right upper quadrant of right eye- patient is able to see motion in that quadrant but cannot count fingers displayed there III, IV, VI: EOMI. Eyelids elevate symmetrically.  V: Sensation is intact to light touch and symmetrical to face.  VII: Smile is symmetrical.   VIII: hearing intact to voice. IX, X: Phonation is normal.  XII: tongue is midline without fasciculations. Motor: 5/5 strength to all muscle groups tested.  Tone: is normal and bulk is normal Sensation- Intact to light touch bilaterally.   Coordination: FTN intact bilaterally.  No  drift.  Gait- deferred   ASSESSMENT/PLAN Ms. Andrea Moses is a 61 y.o. female with history of HTN and smoking presenting with acute onset of headache, right sided numbness and loss of vision in her right eye at 2300 last night.  These symptoms did improve until she had only residual visual deficits in the right upper quadrant of her right eye.  She is able to perceive motion there but is unable to count fingers shown in that quadrant.  Encouraged patient to work with OT diligently and counseled to not drive until cleared to do so.  R BRAO Code Stroke CT head No acute abnormality. ASPECTS 10.    CTA head & neck mild atheromatous changes around carotid bifurcations, 2.3 cm cystic lesion in left paramedian maxilla MRI  No acute abnormality 2D Echo EF 55-60% LDL 161 HgbA1c 4.8 VTE prophylaxis - lovenox aspirin 81 mg daily prior to admission, now on aspirin 81 mg daily and clopidogrel 75 mg daily for 3 weeks and then plavix alone.  Therapy recommendations:  follow up with ophthalmologist Disposition:  home  Hypertension Home meds:  lisinopril 20 mg daily Stable Long-term BP goal normotensive  Hyperlipidemia Home meds:  none LDL 161, goal < 70 Add atorvastatin 80 mg daily Continue statin at discharge  Tobacco abuse Current smoker Smoking cessation counseling provided Pt is willing to quit  Other Stroke Risk Factors   Other Active Problems   Hospital day # 0  Cortney E Ernestina Columbia , MSN, AGACNP-BC [facility] See  Amion for schedule and pager information 06/19/2021 1:07 PM   ATTENDING NOTE: I reviewed above note and agree with the assessment and plan. Pt was seen and examined.   61 year old female with history of hypertension and smoker admitted for headache, right-sided numbness and right eye vision loss improved to blurry vision.  CT no acute abnormality.  CTA head and neck unremarkable.  MRI no acute infarct.  EF 55 to 60%.  LDL 161, A1c 4.8.  UDS negative.   Creatinine 0.70.  On exam, patient still has right eye right upper quadrant blurry in peripheral vision, otherwise neurologically intact.    Etiology for patient symptoms concerning for right BRAO.  No carotid stenosis on CTA, and patient denies any heart palpitation.  Patient also declined heart monitor at this time.  Recommend outpatient follow-up with ophthalmology.  Continue aspirin 81 and Plavix 75 DAPT for 3 weeks and then Plavix alone.  Continue Lipitor 80.  Stroke risk factor modification.  Smoking cessation education provided.  For detailed assessment and plan, please refer to above as I have made changes wherever appropriate.   Neurology will sign off. Please call with questions.  No neuro follow-up needed at this time. Thanks for the consult.   Marvel Plan, MD PhD Stroke Neurology 06/19/2021 4:37 PM    To contact Stroke Continuity provider, please refer to [email or web address]. After hours, contact General Neurology

## 2021-06-19 NOTE — ED Notes (Signed)
ECHO at BS.

## 2021-06-19 NOTE — ED Provider Notes (Signed)
[facility] EMERGENCY DEPARTMENT Provider Note  CSN: [id number] Arrival date & time: 06/19/21 0033  Chief Complaint(s) Headache  HPI Andrea Moses is a 61 y.o. female with a past medical history listed below who presents to the emergency department for a headache with right-sided numbness and right eye vision loss.  Headache began around 2100 last night.  Around 2330 she got up from bed to use the restroom and felt nauseated.  After emesis she began having right-sided numbness.  Approximately 30 minutes liters she had right vision loss entirely of the right eye that lasted for approximately 5 to 10 minutes.  No pain associated with it.  Vision gradually improved but now has blurriness.  No other focal deficits.    Headache  Past Medical History Past Medical History:  Diagnosis Date   Hypertension    Seizures [facility])    Patient Active Problem List   Diagnosis Date Noted   TIA (transient ischemic attack) 06/19/2021   Essential hypertension 06/19/2021   Home Medication(s) Prior to Admission medications   Medication Sig Start Date End Date Taking? Authorizing Provider  aspirin 81 MG EC tablet Take 81 mg by mouth daily.   Yes [provider]  ibuprofen (ADVIL) 200 MG tablet Take 600 mg by mouth every 6 (six) hours as needed for headache or moderate pain.   Yes [provider]  lisinopril (ZESTRIL) 20 MG tablet Take 20 mg by mouth daily.   Yes [provider]  Multiple Vitamins-Minerals (ONE-A-DAY WOMENS PO) Take 1 tablet by mouth daily.   Yes [provider]                                                                                                                                    Allergies Bee venom  Review of Systems Review of Systems  Neurological:  Positive for headaches.  As noted in HPI  Physical Exam Vital Signs  I have reviewed the triage vital signs BP 106/70    Pulse (!) 59    Temp 98 F (36.7 C) (Oral)    Resp  17    SpO2 98%   Physical Exam Vitals reviewed.  Constitutional:      General: She is not in acute distress.    Appearance: She is well-developed. She is not diaphoretic.  HENT:     Head: Normocephalic and atraumatic.     Nose: Nose normal.  Eyes:     General: No scleral icterus.       Right eye: No discharge.        Left eye: No discharge.     Conjunctiva/sclera: Conjunctivae normal.     Pupils: Pupils are equal, round, and reactive to light.  Cardiovascular:     Rate and Rhythm: Normal rate and regular rhythm.     Heart sounds: No murmur heard.   No friction rub. No gallop.  Pulmonary:  Effort: Pulmonary effort is normal. No respiratory distress.     Breath sounds: Normal breath sounds. No stridor. No rales.  Abdominal:     General: There is no distension.     Palpations: Abdomen is soft.     Tenderness: There is no abdominal tenderness.  Musculoskeletal:        General: No tenderness.     Cervical back: Normal range of motion and neck supple.  Skin:    General: Skin is warm and dry.     Findings: No erythema or rash.  Neurological:     Mental Status: She is alert and oriented to person, place, and time.     Comments: Deferred to neurology    ED Results and Treatments Labs (all labs ordered are listed, but only abnormal results are displayed) Labs Reviewed  COMPREHENSIVE METABOLIC PANEL - Abnormal; Notable for the following components:      Result Value   Total Protein 6.2 (*)    All other components within normal limits  URINALYSIS, ROUTINE W REFLEX MICROSCOPIC - Abnormal; Notable for the following components:   Leukocytes,Ua LARGE (*)    All other components within normal limits  LIPID PANEL - Abnormal; Notable for the following components:   Cholesterol 226 (*)    LDL Cholesterol 161 (*)    All other components within normal limits  RESP PANEL BY RT-PCR (FLU A&B, COVID) ARPGX2  ETHANOL  PROTIME-INR  APTT  CBC  DIFFERENTIAL  RAPID URINE DRUG SCREEN,  HOSP PERFORMED  SEDIMENTATION RATE  C-REACTIVE PROTEIN  HEMOGLOBIN A1C  CBC  CREATININE, SERUM  HIV ANTIBODY (ROUTINE TESTING W REFLEX)  CBG MONITORING, ED  I-STAT CHEM 8, ED                                                                                                                         EKG  EKG Interpretation  Date/Time:  Saturday June 19 2021 01:08:32 EST Ventricular Rate:  70 PR Interval:  144 QRS Duration: 90 QT Interval:  376 QTC Calculation: 406 R Axis:   30 Text Interpretation: Normal sinus rhythm Normal ECG When compared with ECG of 03-Apr-2020 00:00, PREVIOUS ECG IS PRESENT Confirmed by Addison Lank [phone]) on 06/19/2021 1:31:26 AM       Radiology CT HEAD CODE STROKE WO CONTRAST  Result Date: 06/19/2021 CLINICAL DATA:  Code stroke. EXAM: CT HEAD WITHOUT CONTRAST TECHNIQUE: Contiguous axial images were obtained from the base of the skull through the vertex without intravenous contrast. RADIATION DOSE REDUCTION: This exam was performed according to the departmental dose-optimization program which includes automated exposure control, adjustment of the mA and/or kV according to patient size and/or use of iterative reconstruction technique. COMPARISON:  None. FINDINGS: Brain: Cerebral volume within normal limits for patient age. No evidence for acute intracranial hemorrhage. No findings to suggest acute large vessel territory infarct. No mass lesion, midline shift, or mass effect. Ventricles are normal in size without evidence for hydrocephalus. No extra-axial fluid  collection identified. Vascular: No hyperdense vessel identified. Skull: Scalp soft tissues demonstrate no acute abnormality. Calvarium intact. Sinuses/Orbits: Globes and orbital soft tissues within normal limits. Visualized paranasal sinuses are clear. No mastoid effusion. ASPECTS [facility] Stroke Program Early CT Score) - Ganglionic level infarction (caudate, lentiform nuclei, internal capsule, insula, M1-M3  cortex): 7 - Supraganglionic infarction (M4-M6 cortex): 3 Total score (0-10 with 10 being normal): 10 IMPRESSION: 1. Negative head CT.  No acute intracranial abnormality. 2. ASPECTS is 10. These results were communicated to Dr. Lorrin Goodell at 1:40 am on 06/19/2021 by text page via the [facility] messaging system. Electronically Signed   By: Jeannine Boga M.D.   On: 06/19/2021 01:41   CT ANGIO HEAD NECK W WO CM (CODE STROKE)  Result Date: 06/19/2021 CLINICAL DATA:  Initial evaluation for neuro deficit, stroke. EXAM: CT ANGIOGRAPHY HEAD AND NECK TECHNIQUE: Multidetector CT imaging of the head and neck was performed using the standard protocol during bolus administration of intravenous contrast. Multiplanar CT image reconstructions and MIPs were obtained to evaluate the vascular anatomy. Carotid stenosis measurements (when applicable) are obtained utilizing NASCET criteria, using the distal internal carotid diameter as the denominator. RADIATION DOSE REDUCTION: This exam was performed according to the departmental dose-optimization program which includes automated exposure control, adjustment of the mA and/or kV according to patient size and/or use of iterative reconstruction technique. CONTRAST:  100 cc of Isovue 370. COMPARISON:  Head CT from earlier the same day. FINDINGS: CTA NECK FINDINGS Aortic arch: Visualized aortic arch normal caliber with normal branch pattern. Mild plaque about the arch and origin the great vessels without significant stenosis. Right carotid system: Right common and internal carotid arteries patent without stenosis or dissection. Mild atheromatous change about the right carotid bulb without significant stenosis. Left carotid system: Left common and internal carotid arteries patent without stenosis or dissection. Mild atheromatous change about the left carotid bulb/proximal left ICA without significant stenosis. Vertebral arteries: Both vertebral arteries arise from the subclavian  arteries. No proximal subclavian artery stenosis. Both vertebral arteries widely patent without stenosis, dissection or occlusion. Skeleton: 2.3 cm expansile cystic lesion seen involving the left paramedian maxilla (series 5, image 101), indeterminate, but could reflect a periapical/radicular cyst versus odontogenic keratocyst. Patient is edentulous. No other discrete or worrisome osseous lesions. Other neck: 8 mm left thyroid nodule noted, of doubtful significance given size and patient age, no follow-up imaging recommended (ref: J Am Coll Radiol. 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 No other acute soft tissue abnormality within the neck. No other acute soft tissue abnormality within the neck. Upper chest: Visualized upper chest demonstrates no acute finding. Review of the MIP images confirms the above findings CTA HEAD FINDINGS Anterior circulation: Petrous segments patent bilaterally. Mild atheromatous change within the carotid siphons without hemodynamically significant stenosis. A1 segments patent bilaterally. Normal anterior communicating artery complex. Anterior cerebral arteries patent without stenosis. No M1 stenosis or occlusion. Normal MCA bifurcations. Distal MCA branches perfused and symmetric. Posterior circulation: Mild atheromatous irregularity within the V4 segments bilaterally without significant stenosis. Right PICA patent. Left PICA not seen. Basilar patent to its distal aspect without stenosis. Superior cerebellar and posterior cerebral arteries patent bilaterally. Venous sinuses: Patent allowing for timing the contrast bolus. Anatomic variants: None significant.  No aneurysm. Review of the MIP images confirms the above findings IMPRESSION: 1. Negative CTA for large vessel occlusion. 2. Mild atheromatous change about the carotid bifurcations and carotid siphons without hemodynamically significant stenosis. 3.  2.3 cm expansile cystic  lesion involving the left paramedian maxilla, indeterminate. Primary differential considerations include a periapical/radicular cyst versus odontogenic keratocyst. 4. 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 Correlation with physical exam. These results were communicated to Dr. Lorrin Goodell at 2:02 am on 06/19/2021 by text page via the [facility] messaging system. Electronically Signed   By: Jeannine Boga M.D.   On: 06/19/2021 02:10    Pertinent labs & imaging results that were available during my care of the patient were reviewed by me and considered in my medical decision making (see MDM for details).  Medications Ordered in ED Medications  aspirin chewable tablet 81 mg (81 mg Oral Given 06/19/21 0322)  clopidogrel (PLAVIX) tablet 75 mg (75 mg Oral Given 06/19/21 0321)   stroke: mapping our early stages of recovery book (has no administration in time range)  0.9 %  sodium chloride infusion ( Intravenous New Bag/Given 06/19/21 0525)  acetaminophen (TYLENOL) tablet 650 mg (has no administration in time range)    Or  acetaminophen (TYLENOL) 160 MG/5ML solution 650 mg (has no administration in time range)    Or  acetaminophen (TYLENOL) suppository 650 mg (has no administration in time range)  enoxaparin (LOVENOX) injection 40 mg (has no administration in time range)  hydrALAZINE (APRESOLINE) injection 10 mg (has no administration in time range)  atorvastatin (LIPITOR) tablet 80 mg (has no administration in time range)  iohexol (OMNIPAQUE) 350 MG/ML injection 75 mL (75 mLs Intravenous Contrast Given 06/19/21 0150)                                                                                                                                     Procedures Procedures  (including critical care time)  Medical Decision Making / ED Course         Differential includes stroke versus complex migraine, renal artery occlusion, amourosis fugax/TIA.  We will also assess for any  metabolic derangements, electrolyte derangements or infectious process. Code stroke was activated.  Neurology at bedside assisting with care.  They also considered temporal arteritis.  Work-up ordered to assess concerns above.  Labs and imaging independently interpreted by me and noted below: CBC without leukocytosis or anemia No significant electrolyte derangements or renal sufficiency No infectious findings CRP and sed rate within normal limits CT without mass or large arterial occlusion  Management: Patient declined tPA Treated with aspirin and Plavix Patient admitted to medicine for stroke work up.    Final Clinical Impression(s) / ED Diagnoses Final diagnoses:  Transient vision disturbance of right eye           This chart was dictated using voice recognition software.  Despite best efforts to proofread,  errors can occur which can change the documentation meaning.    Fatima Blank, MD 06/19/21 0830

## 2021-06-19 NOTE — Discharge Summary (Signed)
Physician Discharge Summary   Patient: Andrea Moses MRN: [id number] DOB: 10-20-1960  Admit date:     06/19/2021  Discharge date: 06/19/21  Discharge Physician: Tyrone Nine   PCP: Patient, No Pcp Per (Inactive)   Recommendations at discharge:   Needs follow up with optometry and/or ophthalmology for further evaluation of decreased visual acuity in the right superotemporal quadrant. Started aspirin, plavix, and statin for stroke risk reduction as discussed below.  Discharge Diagnoses: Principal Problem:   TIA (transient ischemic attack) Active Problems:   Essential hypertension  Hospital Course: Andrea Moses is a 61 y.o. female with history of hypertension, tobacco abuse started experiencing right-sided headache at around 8 PM last night.  The pain was constant and at around midnight patient started sudden onset of loss of vision of the right which lasted 10 minutes and started regaining vision partially but still had loss of vision peripherally on the right eye.  Patient's daughter plan to bring patient to the ER but when tried to ambulate her she was not able to balance herself well felt that she was weak on the right side.   ED Course: In the ER patient was evaluated by the neurologist.  Patient was noticed to have loss of vision on the right eye peripheral vision.  Was able to move all extremities.  CT head followed by CT angiogram of the head and neck was done which did not show any large vessel occlusion.  But there was some concern for possible right-sided vocal cord paralysis.  At this time patient will be admitted for further work-up for possible stroke versus temporal arteritis versus complex migraine.  At time of my exam patient states she has regained almost complete vision of the right eye.  Labs are largely unremarkable EKG shows normal sinus rhythm COVID test is negative.  Hospital Course: The patient's symptoms largely resolved with the exception of decreased visual acuity in  right superotemporal visual field of the right eye, not a true visual field cut. MRI did not reveal any stroke. Inflammatory markers were normal. Echocardiogram revealed no CES or IAS. Stroke neurology has recommended DAPT for 3 weeks, then changing from her chronic aspirin to chronic plavix, and agreed with therapy consultants that outpatient ophthalmology evaluation should be pursued. The patient was later discharged in stable condition.  Assessment and Plan: Suspected branch retinal artery occlusion: No evidence of stroke on neuroimaging. Continue DAPT + new statin per neurology and follow up with ophthalmology and neurology after discharge.   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 Vocal quality appears normal.  Hypertension: Continue home lisinopril Tobacco abuse advised about quitting.  Consultants: Neurology Procedures performed: None  Disposition: Home Diet recommendation:  Cardiac diet  DISCHARGE MEDICATION: Allergies as of 06/19/2021       Reactions   Bee Venom         Medication List     TAKE these medications    aspirin 81 MG EC tablet Take 1 tablet (81 mg total) by mouth daily for 21 days.   atorvastatin 80 MG tablet Commonly known as: LIPITOR Take 1 tablet (80 mg total) by mouth daily. Start taking on: June 20, 2021   clopidogrel 75 MG tablet Commonly known as: PLAVIX Take 1 tablet (75 mg total) by mouth daily. Start taking on: June 20, 2021   ibuprofen 200 MG tablet Commonly known as: ADVIL Take 600 mg by mouth every 6 (six) hours as needed for headache or moderate pain.  lisinopril 20 MG tablet Commonly known as: ZESTRIL Take 20 mg by mouth daily.   ONE-A-DAY WOMENS PO Take 1 tablet by mouth daily.        Follow-up Information     [facility] Call in 2 month(s).   Contact information: [address] [place] [address] [phone]                 Discharge  Exam: BP 124/79 (BP Location: Right Arm)    Pulse 83    Temp 98.9 F (37.2 C) (Oral)    Resp 18    SpO2 100%   No distress, walking with steady gait unassisted. Abnormal visual acuity in right superior lateral visual field without true cut. No other focal deficits. Speaking normally, breathing normally, clear, RRR.   Condition at discharge: stable  The results of significant diagnostics from this hospitalization (including imaging, microbiology, ancillary and laboratory) are listed below for reference.   Imaging Studies: MR BRAIN WO CONTRAST  Result Date: 06/19/2021 CLINICAL DATA:  61 year old female code stroke presentation. Right side amaurosis fugax. EXAM: MRI HEAD WITHOUT CONTRAST TECHNIQUE: Multiplanar, multiecho pulse sequences of the brain and surrounding structures were obtained without intravenous contrast. COMPARISON:  CT head, CTA head and neck earlier today. FINDINGS: Unknown artifact leading to generalized signal loss along the anterior head throughout this exam. Brain: No restricted diffusion to suggest acute infarction. No midline shift, mass effect, evidence of mass lesion, ventriculomegaly, extra-axial collection or acute intracranial hemorrhage. Cervicomedullary junction and pituitary are within normal limits. Cerebral volume is within normal limits for age. Wallace Cullens and white matter signal is within normal limits for age throughout the brain. No cortical encephalomalacia or chronic cerebral blood products identified. Vascular: Major intracranial vascular flow voids are preserved. Skull and upper cervical spine: Negative visible cervical spine. Visualized bone marrow signal is within normal limits. Sinuses/Orbits: Suprasellar cistern and optic chiasm appear within normal limits. Grossly symmetric and negative orbits soft tissues. Paranasal Visualized paranasal sinuses and mastoids are stable and well aerated. Other: Visible internal auditory structures appear normal. Absent dentition.  Prominent but circumscribed lucent lesion of the left maxillary alveolus as discussed on CTA earlier today is T2 hyperintense with no obvious diffusion changes. IMPRESSION: 1. Normal for age noncontrast MRI appearance of the brain. 2. Cystic lesion of the left maxillary alveolus as detailed on CTA today. Electronically Signed   By: Odessa Fleming M.D.   On: 06/19/2021 10:09   ECHOCARDIOGRAM COMPLETE  Result Date: 06/19/2021    ECHOCARDIOGRAM REPORT   Patient Name:   UBAH RADKE Date of Exam: 06/19/2021 Medical Rec #:  [id number]   Height:       61.0 in Accession #:    [id number]  Weight:       180.0 lb Date of Birth:  October 03, 1960   BSA:          1.806 m Patient Age:    61 years    BP:           136/68 mmHg Patient Gender: F           HR:           60 bpm. Exam Location:  Inpatient Procedure: 2D Echo, Cardiac Doppler and Color Doppler Indications:    Ii63.9 stroke  History:        Patient has no prior history of Echocardiogram examinations.                 Risk  Factors:Hypertension.  Sonographer:    [name] Referring Phys: 21 ARSHAD N KAKRAKANDY IMPRESSIONS  1. Left ventricular ejection fraction, by estimation, is 55 to 60%. The left ventricle has normal function. The left ventricle has no regional wall motion abnormalities. Left ventricular diastolic parameters were normal.  2. Right ventricular systolic function is normal. The right ventricular size is normal. Tricuspid regurgitation signal is inadequate for assessing PA pressure.  3. The mitral valve is grossly normal. Trivial mitral valve regurgitation. No evidence of mitral stenosis.  4. The aortic valve is tricuspid. Aortic valve regurgitation is not visualized. Aortic valve sclerosis is present, with no evidence of aortic valve stenosis.  5. The inferior vena cava is normal in size with greater than 50% respiratory variability, suggesting right atrial pressure of 3 mmHg. Conclusion(s)/Recommendation(s): No intracardiac source of embolism detected on this  transthoracic study. Consider a transesophageal echocardiogram to exclude cardiac source of embolism if clinically indicated. FINDINGS  Left Ventricle: Left ventricular ejection fraction, by estimation, is 55 to 60%. The left ventricle has normal function. The left ventricle has no regional wall motion abnormalities. The left ventricular internal cavity size was normal in size. There is  no left ventricular hypertrophy. Left ventricular diastolic parameters were normal. Right Ventricle: The right ventricular size is normal. No increase in right ventricular wall thickness. Right ventricular systolic function is normal. Tricuspid regurgitation signal is inadequate for assessing PA pressure. Left Atrium: Left atrial size was normal in size. Right Atrium: Right atrial size was normal in size. Pericardium: There is no evidence of pericardial effusion. Mitral Valve: The mitral valve is grossly normal. Trivial mitral valve regurgitation. No evidence of mitral valve stenosis. Tricuspid Valve: The tricuspid valve is grossly normal. Tricuspid valve regurgitation is trivial. No evidence of tricuspid stenosis. Aortic Valve: The aortic valve is tricuspid. Aortic valve regurgitation is not visualized. Aortic valve sclerosis is present, with no evidence of aortic valve stenosis. Aortic valve mean gradient measures 4.0 mmHg. Aortic valve peak gradient measures 7.0  mmHg. Aortic valve area, by VTI measures 1.87 cm. Pulmonic Valve: The pulmonic valve was grossly normal. Pulmonic valve regurgitation is trivial. No evidence of pulmonic stenosis. Aorta: The aortic root and ascending aorta are structurally normal, with no evidence of dilitation. Venous: The inferior vena cava is normal in size with greater than 50% respiratory variability, suggesting right atrial pressure of 3 mmHg. IAS/Shunts: The atrial septum is grossly normal.  LEFT VENTRICLE PLAX 2D LVIDd:         4.10 cm     Diastology LVIDs:         2.30 cm     LV e' medial:     8.92 cm/s LV PW:         1.00 cm     LV E/e' medial:  8.3 LV IVS:        1.10 cm     LV e' lateral:   9.68 cm/s LVOT diam:     1.90 cm     LV E/e' lateral: 7.7 LV SV:         52 LV SV Index:   29 LVOT Area:     2.84 cm  LV Volumes (MOD) LV vol d, MOD A2C: 81.6 ml LV vol d, MOD A4C: 57.8 ml LV vol s, MOD A2C: 25.7 ml LV vol s, MOD A4C: 26.9 ml LV SV MOD A2C:     55.9 ml LV SV MOD A4C:     57.8 ml LV SV MOD BP:  41.4 ml RIGHT VENTRICLE             IVC RV S prime:     12.75 cm/s  IVC diam: 1.50 cm RVOT diam:      2.00 cm TAPSE (M-mode): 2.3 cm LEFT ATRIUM             Index        RIGHT ATRIUM           Index LA diam:        3.80 cm 2.10 cm/m   RA Area:     17.50 cm LA Vol (A2C):   60.2 ml 33.33 ml/m  RA Volume:   50.10 ml  27.74 ml/m LA Vol (A4C):   44.6 ml 24.69 ml/m LA Biplane Vol: 52.1 ml 28.85 ml/m  AORTIC VALVE                    PULMONIC VALVE AV Area (Vmax):    1.73 cm     PV Vmax:       0.48 m/s AV Area (Vmean):   1.71 cm     PV Vmean:      29.600 cm/s AV Area (VTI):     1.87 cm     PV VTI:        0.107 m AV Vmax:           132.00 cm/s  PV Peak grad:  0.9 mmHg AV Vmean:          87.300 cm/s  PV Mean grad:  0.0 mmHg AV VTI:            0.281 m AV Peak Grad:      7.0 mmHg AV Mean Grad:      4.0 mmHg LVOT Vmax:         80.50 cm/s LVOT Vmean:        52.800 cm/s LVOT VTI:          0.185 m LVOT/AV VTI ratio: 0.66  AORTA Ao Root diam: 2.60 cm Ao Asc diam:  3.30 cm MITRAL VALVE               TRICUSPID VALVE MV Area (PHT): 3.48 cm    TV Peak grad:   15.5 mmHg MV Decel Time: 218 msec    TV Mean grad:   10.0 mmHg MV E velocity: 74.40 cm/s  TV Vmax:        1.97 m/s MV A velocity: 91.30 cm/s  TV Vmean:       151.0 cm/s MV E/A ratio:  0.81        TV VTI:         0.62 msec                             SHUNTS                            Systemic VTI:  0.18 m                            Systemic Diam: 1.90 cm                            Pulmonic Diam: 2.00 cm Lennie OdorWesley O'Neal MD Electronically signed by Lennie OdorWesley O'Neal MD  Signature Date/Time: 06/19/2021/1:16:04 PM  Final    CT HEAD CODE STROKE WO CONTRAST  Result Date: 06/19/2021 CLINICAL DATA:  Code stroke. EXAM: CT HEAD WITHOUT CONTRAST TECHNIQUE: Contiguous axial images were obtained from the base of the skull through the vertex without intravenous contrast. RADIATION DOSE REDUCTION: This exam was performed according to the departmental dose-optimization program which includes automated exposure control, adjustment of the mA and/or kV according to patient size and/or use of iterative reconstruction technique. COMPARISON:  None. FINDINGS: Brain: Cerebral volume within normal limits for patient age. No evidence for acute intracranial hemorrhage. No findings to suggest acute large vessel territory infarct. No mass lesion, midline shift, or mass effect. Ventricles are normal in size without evidence for hydrocephalus. No extra-axial fluid collection identified. Vascular: No hyperdense vessel identified. Skull: Scalp soft tissues demonstrate no acute abnormality. Calvarium intact. Sinuses/Orbits: Globes and orbital soft tissues within normal limits. Visualized paranasal sinuses are clear. No mastoid effusion. ASPECTS [facility] Stroke Program Early CT Score) - Ganglionic level infarction (caudate, lentiform nuclei, internal capsule, insula, M1-M3 cortex): 7 - Supraganglionic infarction (M4-M6 cortex): 3 Total score (0-10 with 10 being normal): 10 IMPRESSION: 1. Negative head CT.  No acute intracranial abnormality. 2. ASPECTS is 10. These results were communicated to Dr. Derry Lory at 1:40 am on 06/19/2021 by text page via the [facility] messaging system. Electronically Signed   By: Rise Mu M.D.   On: 06/19/2021 01:41   CT ANGIO HEAD NECK W WO CM (CODE STROKE)  Result Date: 06/19/2021 CLINICAL DATA:  Initial evaluation for neuro deficit, stroke. EXAM: CT ANGIOGRAPHY HEAD AND NECK TECHNIQUE: Multidetector CT imaging of the head and neck was performed using the standard  protocol during bolus administration of intravenous contrast. Multiplanar CT image reconstructions and MIPs were obtained to evaluate the vascular anatomy. Carotid stenosis measurements (when applicable) are obtained utilizing NASCET criteria, using the distal internal carotid diameter as the denominator. RADIATION DOSE REDUCTION: This exam was performed according to the departmental dose-optimization program which includes automated exposure control, adjustment of the mA and/or kV according to patient size and/or use of iterative reconstruction technique. CONTRAST:  100 cc of Isovue 370. COMPARISON:  Head CT from earlier the same day. FINDINGS: CTA NECK FINDINGS Aortic arch: Visualized aortic arch normal caliber with normal branch pattern. Mild plaque about the arch and origin the great vessels without significant stenosis. Right carotid system: Right common and internal carotid arteries patent without stenosis or dissection. Mild atheromatous change about the right carotid bulb without significant stenosis. Left carotid system: Left common and internal carotid arteries patent without stenosis or dissection. Mild atheromatous change about the left carotid bulb/proximal left ICA without significant stenosis. Vertebral arteries: Both vertebral arteries arise from the subclavian arteries. No proximal subclavian artery stenosis. Both vertebral arteries widely patent without stenosis, dissection or occlusion. Skeleton: 2.3 cm expansile cystic lesion seen involving the left paramedian maxilla (series 5, image 101), indeterminate, but could reflect a periapical/radicular cyst versus odontogenic keratocyst. Patient is edentulous. No other discrete or worrisome osseous lesions. Other neck: 8 mm left thyroid nodule noted, of doubtful significance given size and patient age, no follow-up imaging recommended (ref: J Am Coll Radiol. 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  No other acute soft tissue abnormality within the neck. No other acute soft tissue abnormality within the neck. Upper chest: Visualized upper chest demonstrates no acute finding. Review of the MIP images confirms the above findings CTA HEAD FINDINGS Anterior circulation: Petrous segments patent bilaterally. Mild  atheromatous change within the carotid siphons without hemodynamically significant stenosis. A1 segments patent bilaterally. Normal anterior communicating artery complex. Anterior cerebral arteries patent without stenosis. No M1 stenosis or occlusion. Normal MCA bifurcations. Distal MCA branches perfused and symmetric. Posterior circulation: Mild atheromatous irregularity within the V4 segments bilaterally without significant stenosis. Right PICA patent. Left PICA not seen. Basilar patent to its distal aspect without stenosis. Superior cerebellar and posterior cerebral arteries patent bilaterally. Venous sinuses: Patent allowing for timing the contrast bolus. Anatomic variants: None significant.  No aneurysm. Review of the MIP images confirms the above findings IMPRESSION: 1. Negative CTA for large vessel occlusion. 2. Mild atheromatous change about the carotid bifurcations and carotid siphons without hemodynamically significant stenosis. 3. 2.3 cm expansile cystic lesion involving the left paramedian maxilla, indeterminate. Primary differential considerations include a periapical/radicular cyst versus odontogenic keratocyst. 4. 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 Correlation with physical exam. These results were communicated to Dr. Derry LoryKhaliqdina at 2:02 am on 06/19/2021 by text page via the [facility] messaging system. Electronically Signed   By: Rise MuBenjamin  McClintock M.D.   On: 06/19/2021 02:10    Microbiology: Results for orders placed or performed during the hospital encounter of 06/19/21  Resp Panel by RT-PCR (Flu A&B, Covid) Nasopharyngeal Swab     Status: None   Collection  Time: 06/19/21  1:12 AM   Specimen: Nasopharyngeal Swab; Nasopharyngeal(NP) swabs in vial transport medium  Result Value Ref Range Status   SARS Coronavirus 2 by RT PCR NEGATIVE NEGATIVE Final    Comment: (NOTE) SARS-CoV-2 target nucleic acids are NOT DETECTED.  The SARS-CoV-2 RNA is generally detectable in upper respiratory specimens during the acute phase of infection. The lowest concentration of SARS-CoV-2 viral copies this assay can detect is 138 copies/mL. A negative result does not preclude SARS-Cov-2 infection and should not be used as the sole basis for treatment or other patient management decisions. A negative result may occur with  improper specimen collection/handling, submission of specimen other than nasopharyngeal swab, presence of viral mutation(s) within the areas targeted by this assay, and inadequate number of viral copies(<138 copies/mL). A negative result must be combined with clinical observations, patient history, and epidemiological information. The expected result is Negative.  Fact Sheet for Patients:  BloggerCourse.comhttps://www.fda.gov/media/152166/download  Fact Sheet for Healthcare Providers:  SeriousBroker.ithttps://www.fda.gov/media/152162/download  This test is no t yet approved or cleared by the [place] FDA and  has been authorized for detection and/or diagnosis of SARS-CoV-2 by FDA under an Emergency Use Authorization (EUA). This EUA will remain  in effect (meaning this test can be used) for the duration of the COVID-19 declaration under Section 564(b)(1) of the Act, 21 U.S.C.section 360bbb-3(b)(1), unless the authorization is terminated  or revoked sooner.       Influenza A by PCR NEGATIVE NEGATIVE Final   Influenza B by PCR NEGATIVE NEGATIVE Final    Comment: (NOTE) The Xpert Xpress SARS-CoV-2/FLU/RSV plus assay is intended as an aid in the diagnosis of influenza from Nasopharyngeal swab specimens and should not be used as a sole basis for treatment. Nasal washings  and aspirates are unacceptable for Xpert Xpress SARS-CoV-2/FLU/RSV testing.  Fact Sheet for Patients: BloggerCourse.comhttps://www.fda.gov/media/152166/download  Fact Sheet for Healthcare Providers: SeriousBroker.ithttps://www.fda.gov/media/152162/download  This test is not yet approved or cleared by the [place] FDA and has been authorized for detection and/or diagnosis of SARS-CoV-2 by FDA under an Emergency Use Authorization (EUA). This EUA will remain in effect (meaning this test can be used) for the  duration of the COVID-19 declaration under Section 564(b)(1) of the Act, 21 U.S.C. section 360bbb-3(b)(1), unless the authorization is terminated or revoked.  Performed at [facility], [address], [place], [place] [address]     Labs: CBC: Recent Labs  Lab 06/19/21 0125 06/19/21 0138 06/19/21 0513  WBC 6.1  --  5.2  NEUTROABS 3.1  --   --   HGB 13.3 13.3 12.3  HCT 39.5 39.0 36.5  MCV 95.0  --  94.3  PLT 204  --  191   Basic Metabolic Panel: Recent Labs  Lab 06/19/21 0125 06/19/21 0138 06/19/21 0513  NA 140 143  --   K 4.1 4.2  --   CL 106 106  --   CO2 26  --   --   GLUCOSE 89 83  --   BUN 12 13  --   CREATININE 0.78 0.70 0.77  CALCIUM 9.5  --   --    Liver Function Tests: Recent Labs  Lab 06/19/21 0125  AST 19  ALT 16  ALKPHOS 55  BILITOT 0.3  PROT 6.2*  ALBUMIN 3.8   CBG: Recent Labs  Lab 06/19/21 0104  GLUCAP 91    Discharge time spent: greater than 30 minutes.  Signed: Tyrone Nine, MD [facility] 06/19/2021

## 2021-06-19 NOTE — Evaluation (Signed)
Physical Therapy Evaluation Patient Details Name: Andrea Moses MRN: [id number] DOB: Mar 20, 1961 Today's Date: 06/19/2021  History of Present Illness  The pt is a 61 yo female presenting 2/18 with headache, numbness on L side, and transient loss of R vision. Undergoing work up for CVA vs hemiplegic migraine. PMH includes: HTN, tobacco use, and seizures.   Clinical Impression  Pt in bed upon arrival of PT, agreeable to evaluation at this time. 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 The pt was able to demo good independence with sit-stand transfers and normal gait, but did demo a few instances of mild instability with balance challenges (horizontal head turns while walking and tandem walking). She was able to ambulate ~200 ft in the hall without UE support and complete repeated sit-stand transfers from low chair without use of UE. We discussed safety concerns with return to work and mobilizing in the home, pt verbalized understanding of all recommendations but also demos possible cog deficits in higher-level processing and problem solving (described below). The pt has no DME needs or need for follow up therapies at this time, plan to sign off. Please re-consult if change in status.   Dynamic Gait Index (DGI): 18/24 (<19 indicates increased risk for falls)  Gait Speed: 0.71m/s. Gait speed < 1.83m/s indicates increased risk of falls)  5X Sit-to-Stand: 15 sec (> 11.4 sec indicates increased risk of falls for individuals aged 60-69, > 15 sec indicates increased risk of recurrent falls)   Recommendations for follow up therapy are one component of a multi-disciplinary discharge planning process, led by the attending physician.  Recommendations may be updated based on patient status, additional functional criteria and insurance authorization.  Follow Up Recommendations No PT follow up     Assistance Recommended at Discharge Intermittent Supervision/Assistance  Patient can return home with the following  Assist for transportation    Equipment Recommendations None recommended by PT  Recommendations for Other Services       Functional Status Assessment Patient has not had a recent decline in their functional status     Precautions / Restrictions Precautions Precautions: Fall Precaution Comments: low fall Restrictions Weight Bearing Restrictions: No      Mobility  Bed Mobility Overal bed mobility: Independent             General bed mobility comments: slightly increased time but overal functional    Transfers Overall transfer level: Independent Equipment used: None               General transfer comment: sit-stand from elevated ED stretcher without issue and sit-stand x10 ftom low chair without use of UE    Ambulation/Gait Ambulation/Gait assistance: Min guard, Supervision Gait Distance (Feet): 200 Feet Assistive device: None Gait Pattern/deviations: Step-through pattern, Decreased stride length, Shuffle Gait velocity: 0.63 m/s Gait velocity interpretation: 1.31 - 2.62 ft/sec, indicative of limited community ambulator   General Gait Details: pt with slightly slowed gait and mild instability when challenged. no overt LOB, no UE support   Modified Rankin (Stroke Patients Only) Modified Rankin (Stroke Patients Only) Pre-Morbid Rankin Score: No symptoms Modified Rankin: Moderately severe disability     Balance Overall balance assessment: Needs assistance Sitting-balance support: No upper extremity supported, Feet unsupported Sitting balance-Leahy Scale: Good     Standing balance support: No upper extremity supported, During functional activity Standing balance-Leahy Scale: Good  Tandem Stance - Right Leg: 10 Tandem Stance - Left Leg: 8 Rhomberg - Eyes Opened: 15   High level balance activites: Side stepping, Direction  changes, Turns, Sudden stops, Head turns High Level Balance Comments: most difficulty with tandem walking and lateral head turns. no overt LOB Standardized Balance Assessment Standardized Balance Assessment : Dynamic Gait Index   Dynamic Gait Index Level Surface: Normal Change in Gait Speed: Normal Gait with Horizontal Head Turns: Moderate Impairment Gait with Vertical Head Turns: Mild Impairment Gait and Pivot Turn: Mild Impairment Step Over Obstacle: Mild Impairment Step Around Obstacles: Normal Steps: Mild Impairment Total Score: 18       Pertinent Vitals/Pain Pain Assessment Pain Assessment: No/denies pain    Home Living Family/patient expects to be discharged to:: Private residence Living Arrangements: Children (daughter) Available Help at Discharge: Family;Available PRN/intermittently Type of Home: House Home Access: Stairs to enter Entrance Stairs-Rails: Left Entrance Stairs-Number of Steps: 5   Home Layout: One level Home Equipment: None      Prior Function Prior Level of Function : Independent/Modified Independent;Driving;Working/employed (works as Therapist, sports at rehab facility)             Mobility Comments: does walk sometimes for activity, working currently as Therapist, sports ADLs Comments: independent     Journalist, newspaper   Dominant Hand: Right    Extremity/Trunk Assessment   Upper Extremity Assessment Upper Extremity Assessment: Overall WFL for tasks assessed    Lower Extremity Assessment Lower Extremity Assessment: Overall WFL for tasks assessed    Cervical / Trunk Assessment Cervical / Trunk Assessment: Kyphotic  Communication   Communication: No difficulties  Cognition Arousal/Alertness: Awake/alert Behavior During Therapy: WFL for tasks assessed/performed Overall Cognitive Status: No family/caregiver present to determine baseline cognitive functioning                                 General Comments: pt able to follow all simple commands,  possible deficits in higher-level processing and problem solving as 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        General Comments General comments (skin integrity, edema, etc.): VSS on RA    Exercises Other Exercises Other Exercises: 5x sit-stand from low chair without UE: 15 sec Other Exercises: 30 sec sit-stand from low chair without UE: 10 reps   Assessment/Plan    PT Assessment Patient does not need any further PT services  PT Problem List Decreased activity tolerance;Decreased balance       PT Treatment Interventions Gait training;Functional mobility training;Therapeutic activities;Therapeutic exercise;Balance training    PT Goals (Current goals can be found in the Care Plan section)  Acute Rehab PT Goals Patient Stated Goal: return to work PT Goal Formulation: With patient Time For Goal Achievement: 07/03/21 Potential to Achieve Goals: Good     AM-PAC PT "6 Clicks" Mobility  Outcome Measure Help needed turning from your back to your side while in a flat bed without using bedrails?: None Help needed moving from lying on your back to sitting on the side of a flat bed without using bedrails?: None Help needed moving to and from a bed to a chair (including a wheelchair)?: None Help needed standing up from a chair using your arms (e.g., wheelchair or bedside chair)?: None Help needed to walk in hospital room?:  A Little Help needed climbing 3-5 steps with a railing? : A Little 6 Click Score: 22    End of Session Equipment Utilized During Treatment: Gait belt Activity Tolerance: Patient tolerated treatment well Patient left: in bed;with call bell/phone within reach Nurse Communication: Mobility status PT Visit Diagnosis: Unsteadiness on feet (R26.81)    [id number] PT Time Calculation (min) (ACUTE ONLY): 22 min   Charges:   PT Evaluation $PT Eval Low  Complexity: 1 Low          West Carbo, PT, DPT   Acute Rehabilitation Department Pager #: [phone]  Sandra Cockayne 06/19/2021, 11:20 AM

## 2021-06-19 NOTE — ED Notes (Signed)
Meds delayed, pt in MRI.

## 2021-06-19 NOTE — Evaluation (Signed)
Occupational Therapy Evaluation Patient Details Name: Andrea Moses MRN: [id number] DOB: [birth year] Today's Date: 06/19/2021   History of Present Illness The pt is a 61 yo female presenting 2/18 with headache, numbness on L side, and transient loss of R vision. Undergoing work up for CVA vs hemiplegic migraine. PMH includes: HTN, tobacco use, and seizures.   Clinical Impression   Patient evaluated by Occupational Therapy with no further acute OT needs identified. All education has been completed and the patient has no further questions. Pt with Rt superior quadrant loss Rt eye.  Discussed compensatory strategies and safety when in community environments. See below for any follow-up Occupational Therapy or equipment needs. OT is signing off. Thank you for this referral.       Recommendations for follow up therapy are one component of a multi-disciplinary discharge planning process, led by the attending physician.  Recommendations may be updated based on patient status, additional functional criteria and insurance authorization.   Follow Up Recommendations  Other (comment) (recommend follow up with optometrist/ophthalmologist for automated perimetry testing)    Assistance Recommended at Discharge None  Patient can return home with the following      Functional Status Assessment  Patient has had a recent decline in their functional status and demonstrates the ability to make significant improvements in function in a reasonable and predictable amount of time.  Equipment Recommendations  None recommended by OT    Recommendations for Other Services       Precautions / Restrictions Precautions Precautions: Fall Precaution Comments: low fall Restrictions Weight Bearing Restrictions: No      Mobility Bed Mobility Overal bed mobility: Independent                  Transfers                          Balance                                            ADL either performed or assessed with clinical judgement   ADL Overall ADL's : Independent                                             Vision Baseline Vision/History: 1 Wears glasses Ability to See in Adequate Light: 0 Adequate Patient Visual Report: Peripheral vision impairment Vision Assessment?: Yes Eye Alignment: Within Functional Limits Ocular Range of Motion: Within Functional Limits Alignment/Gaze Preference: Within Defined Limits Tracking/Visual Pursuits: Other (comment) (loses object in Rt superior quadrant) Visual Fields: Right visual field deficit (Rt superior quadrant loss Rt eye)     Perception     Praxis      Pertinent Vitals/Pain Pain Assessment Pain Assessment: No/denies pain     Hand Dominance Right   Extremity/Trunk Assessment Upper Extremity Assessment Upper Extremity Assessment: Overall WFL for tasks assessed   Lower Extremity Assessment Lower Extremity Assessment: Overall WFL for tasks assessed   Cervical / Trunk Assessment Cervical / Trunk Assessment: Kyphotic   Communication Communication Communication: No difficulties   Cognition Arousal/Alertness: Awake/alert Behavior During Therapy: WFL for tasks assessed/performed Overall Cognitive Status: Within Functional Limits for tasks assessed  General Comments: pt able to follow all commands, responding appropriately to questions and conversation     General Comments  Discussed implications of visual deficit with pt and instructed her on quick eye movements into the Rt superior quadrant to efficiently scan.  recommend that she not drive until cleared by MD, and until she has time to adjust to visual changes.  Discussed that stop lights may be in that area of vision loss and to be very cautious of low hanging tree branches with ambulating outside.  Recommended that she follow up with her optometrist/ophthalmologist for an automated  perimetry test    Exercises     Shoulder Instructions      Home Living Family/patient expects to be discharged to:: Private residence Living Arrangements: Children (daughter) Available Help at Discharge: Family;Available PRN/intermittently Type of Home: House Home Access: Stairs to enter [employer] of Steps: 5 Entrance Stairs-Rails: Left Home Layout: One level     Bathroom Shower/Tub: Chief Strategy Officer: Standard     Home Equipment: None   Additional Comments: Pt reports she lives in [place], but visits daughter frequently for extended stays      Prior Functioning/Environment Prior Level of Function : Independent/Modified Independent;Driving;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)             Mobility Comments: does walk sometimes for activity, working currently as Charity fundraiser ADLs Comments: independent        OT Problem List: Impaired vision/perception      OT Treatment/Interventions:      OT Goals(Current goals can be found in the care plan section) Acute Rehab OT Goals Patient Stated Goal: to go home OT Goal Formulation: All assessment and education complete, DC therapy  OT Frequency:      Co-evaluation              AM-PAC OT "6 Clicks" Daily Activity     Outcome Measure Help from another person eating meals?: None Help from another person taking care of personal grooming?: None Help from another person toileting, which includes using toliet, bedpan, or urinal?: None Help from another person bathing (including washing, rinsing, drying)?: None Help from another person to put on and taking off regular upper body clothing?: None Help from another person to put on and taking off regular lower body clothing?: None 6 Click Score: 24   End of Session Nurse Communication: Mobility status  Activity Tolerance: Patient tolerated treatment well Patient left: in bed;with call bell/phone within reach  OT Visit Diagnosis: Low vision,  both eyes (H54.2)                Time: 4259-5638 OT Time Calculation (min): 13 min Charges:  OT General Charges $OT Visit: 1 Visit OT Evaluation $OT Eval Low Complexity: 1 Low  Eber Jones., OTR/L Acute Rehabilitation Services Pager [phone] Office [phone]   Jeani Hawking M 06/19/2021, 11:31 AM

## 2021-06-19 NOTE — ED Notes (Signed)
Back from MRI, neuro team NP at [facility]. Pt reports no changes. "Feels better".

## 2021-06-19 NOTE — Consult Note (Addendum)
NEUROLOGY CONSULTATION NOTE   Date of service: June 19, 2021 Patient Name: Andrea Moses MRN:  [id number] DOB:  11-09-1960 Reason for consult: "Headache + Subjective R sided numbness + R mono-occular superotemporal quadrant vision loss" Requesting Provider: Fatima Blank, * _ _ _   _ __   _ __ _ _  __ __   _ __   __ _  History of Present Illness  Andrea Moses is a 61 y.o. female with PMH significant for HTN, smoker who presents with headache along with subjective right-sided numbness and blurry vision in the right upper quadrant of her right eye.  Patient reports that she started having a headache somewhere between 8 PM and 9 PM on 06/18/2021.  She went to bed and she woke up around 2330 on 06/18/2021 to go use the bathroom.  She felt nauseous and she threw up and immediately after that the entire right side of her body all the way from her face down to her toes went numb.  This gradually improved but she still reports subjective tingling in her fingertips on the right side.  In addition, she reports that around midnight, her entire right eye went blind all of a sudden.  She was unable to see and this went on for 5 to 10 minutes.  She had no pain in her right eye, no redness of her eye, did not feel like a curtain drop down. she has never had anything like this in the past.  She reports that her vision in her right eye has significantly improved but she is still experiencing vision deficit in the right eye when she looks up and out.   LKW: 2330 on 06/18/21. mRS: 0 tNKASE: I spoke to her about this being a potential Branch Retinal Artery occlusion and did offer her tNKASE. Data for CRAO/BRAO shows that thrombolytics is only effective about 15-20% of the time in CRAO/BRAO. I discussed risk of hemorrhage of between 3-6%. Patient felt that her current deficit is non disabling and felt the risk of ICH is too high for her tolerance. She therefore declined tNKASE at this time. Thrombectomy: Not  offered, no LVO. NIHSS components Score: Comment  1a Level of Conscious 0$RemoveBefor'[x]'AtiIpObEcWTi$  '[]'$  2$R'[]'AW$  '[]'$      1b LOC Questions 0$RemoveBefore'[x]'EncXrIJMuTfRL$  '[]'$  2$R'[]'aW$       1c LOC Commands 0$RemoveBefor'[x]'dOQglxnZTxcO$  '[]'$  2$R'[]'Bl$       2 Best Gaze 0$RemoveBe'[x]'pUVnWxBQM$  '[]'$  2$R'[]'hi$       3 Visual 0$RemoveB'[]'QLDeeKdy$  '[x]'$  2$Re'[]'hdB$  '[]'$      4 Facial Palsy 0$RemoveBefor'[x]'RvMxqGZOltSg$  '[]'$  2$R'[]'IP$  '[]'$      5a Motor Arm - left 0$Remov'[x]'LvRHxy$  '[]'$  2$R'[]'rI$  '[]'$  4$R'[]'cQ$  U'[]'$    5b Motor Arm - Right 0$Remove'[x]'PRWESfR$  '[]'$  2$R'[]'fe$  '[]'$  4$R'[]'re$  U'[]'$    6a Motor Leg - Left 0$Remov'[x]'iXUyEJ$  '[]'$  2$R'[]'iK$  '[]'$  4$R'[]'CB$  U'[]'$    6b Motor Leg - Right 0$Remove'[x]'uwwcqYp$  '[]'$  2$R'[]'rK$  '[]'$  4$R'[]'ZV$  U'[]'$    7 Limb Ataxia 0$RemoveBefo'[x]'XuZZPoMGtSf$  '[]'$  2$R'[]'tR$  '[]'$  UN$R'[]'HH$     8 Sensory 0$RemoveBe'[x]'lSRTIpyEK$  '[]'$  2$R'[]'fE$  U'[]'$      9 Best Language 0$RemoveBefore'[x]'YubOdGTwQBsKU$  '[]'$  2$R'[]'Qw$  '[]'$      10 Dysarthria 0$RemoveBefore'[x]'KFqqWPYgyLsRG$  '[]'$  2$R'[]'my$  U'[]'$      11 Extinct. and Inattention 0$RemoveBeforeDE'[x]'YOEKVKmIZBDImxI$  '[]'$  2$R'[]'zT$       TOTAL: 1     ROS   Constitutional Denies weight loss, fever and chills.   HEENT changes in vision as above, no problems with hearing.   Respiratory Denies SOB and cough.   CV Denies palpitations and CP   GI Denies abdominal pain, nausea, vomiting and diarrhea.  GU Denies dysuria and urinary frequency.   MSK Denies myalgia and joint pain.   Skin Denies rash and pruritus.   Neurological Denies headache and syncope.   Psychiatric Denies recent changes in mood. Denies anxiety and depression.    Past History   Past Medical History:  Diagnosis Date   Seizures ([place])    History reviewed. No pertinent surgical history. Family History  Problem Relation Age of Onset   Cerebral aneurysm Mother    Hypertension Father    Social History   Socioeconomic History   Marital status: Single    Spouse name: Not on file   Number of children: Not on file   Years of education: Not on file   Highest education level: Not on file  Occupational History   Not on file  Tobacco Use   Smoking status: Every Day    Packs/day: 0.50    Types: Cigarettes   Smokeless tobacco: Never  Vaping Use   Vaping Use: Never used  Substance and Sexual Activity   Alcohol use: Never   Drug use: Never   Sexual activity: Not on file  Other Topics Concern   Not on file   Social History Narrative   Not on file   Social Determinants of Health   Financial Resource Strain: Not on file  Food Insecurity: Not on file  Transportation Needs: Not on file  Physical Activity: Not on file  Stress: Not on file  Social Connections: Not on file   Allergies  Allergen Reactions   Bee Venom     Medications  (Not in a hospital admission)    Vitals   Vitals:   06/19/21 0058  BP: (!) 159/87  Pulse: 71  Resp: 13  Temp: 98.4 F (36.9 C)  TempSrc: Oral  SpO2: 98%     There is no height or weight on file to calculate BMI.  Physical Exam   General: Laying comfortably in bed; in no acute distress.  HENT: Normal oropharynx and mucosa. Normal external appearance of ears and nose.  Neck: Supple, no pain or tenderness  CV: No JVD. No peripheral edema.  Pulmonary: Symmetric Chest rise. Normal respiratory effort.  Abdomen: Soft to touch, non-tender.  Ext: No cyanosis, edema, or deformity  Skin: No rash. Normal palpation of skin.   Musculoskeletal: Normal digits and nails by inspection. No clubbing.   Neurologic Examination  Mental status/Cognition: Alert, oriented to self, place, month and year, good attention.  Speech/language: Fluent, comprehension intact, object naming intact, repetition intact.  Cranial nerves:   CN II Pupils equal and reactive to light, R mono-occular superotemporal quadrant vision loss   CN III,IV,VI EOM intact, no gaze preference or deviation, no nystagmus    CN V normal sensation in V1, V2, and V3 segments bilaterally    CN VII no asymmetry, no nasolabial fold flattening    CN VIII normal hearing to speech    CN IX & X normal palatal elevation, no uvular deviation    CN XI 5/5 head turn and 5/5 shoulder shrug bilaterally    CN XII midline tongue protrusion    Motor:  Muscle bulk: normal, tone normal, pronator drift none tremor none Mvmt Root Nerve  Muscle Right Left Comments  SA C5/6 Ax Deltoid 5 5   EF C5/6 Mc Biceps 5 5    EE C6/7/8 Rad Triceps 5 5   WF C6/7 Med FCR     WE C7/8 PIN ECU     F Ab C8/T1  U ADM/FDI 5 5   HF L1/2/3 Fem Illopsoas 5 5   KE L2/3/4 Fem Quad 5 5   DF L4/5 D Peron Tib Ant 5 5   PF S1/2 Tibial Grc/Sol 5 5    Reflexes:  Right Left Comments  Pectoralis      Biceps (C5/6) 2 2   Brachioradialis (C5/6) 2 2    Triceps (C6/7) 2 2    Patellar (L3/4) 2 2    Achilles (S1)      Hoffman      Plantar     Jaw jerk    Sensation:  Light touch Intact throughout, some subjective R hand tingling.   Pin prick    Temperature    Vibration   Proprioception    Coordination/Complex Motor:  - Finger to Nose intact BL - Heel to shin intact BL - Rapid alternating movement with mild incoordination in RUE. - Gait: Deferred.  Labs   CBC:  Recent Labs  Lab 06/19/21 0125 06/19/21 0138  WBC 6.1  --   NEUTROABS 3.1  --   HGB 13.3 13.3  HCT 39.5 39.0  MCV 95.0  --   PLT 204  --     Basic Metabolic Panel:  Lab Results  Component Value Date   NA 143 06/19/2021   K 4.2 06/19/2021   CO2 23 04/03/2020   GLUCOSE 83 06/19/2021   BUN 13 06/19/2021   CREATININE 0.70 06/19/2021   CALCIUM 9.4 04/03/2020   GFRNONAA >60 04/03/2020   GFRAA >60 10/12/2019   Lipid Panel: No results found for: LDLCALC HgbA1c: No results found for: HGBA1C Urine Drug Screen: No results found for: LABOPIA, COCAINSCRNUR, LABBENZ, AMPHETMU, THCU, LABBARB  Alcohol Level No results found for: [place]  CT Head without contrast(Personally reviewed): CTH was negative for a large hypodensity concerning for a large territory infarct or hyperdensity concerning for an [place]  CT angio Head and Neck with contrast(Personally reviewed): No LVO on my read.  MRI Brain: pending  Impression   Andrea Moses is a 61 y.o. female with PMH significant for HTN, smoker who presents with headache along with subjective right-sided numbness and blurry vision in the right upper quadrant of her right eye. Around midnight, she had complete sudden  onset painless vision loss in R eye for 5-10 mins with most of the vision recovered but still has persistent R mono-occular superotemporal quadrant vision loss.  The constellation of her symptoms with headache + R sided numbness and vision could be just a hemiplegic migraine. However CRAO progressing to BRAO or amourosis fugax is also on the differential. She does have stroke risk factors including smoking and hypertension. GCA is another consideration specially given her advanced age along with headache and vision changes.  Primary Diagnosis:  Cerebral infarction, unspecified.  Secondary Diagnosis: Essential (primary) hypertension  Recommendations   Recommend that primary team order following: - Frequent Neuro checks per stroke unit protocol - Recommend brain imaging with MRI Brain without contrast - Recommend obtaining TTE - Recommend obtaining Lipid panel with LDL - Please start statin if LDL > 70 - Recommend HbA1c - Antithrombotic - Aspirin $RemoveB'81mg'VyAEaIDZ$  daily along with plavix $RemoveBef'75mg'yLkfKDUMTb$  daily x 21 days followed by Aspirin $RemoveBe'81mg'kYfbKzlGZ$  daily alone. - Recommend DVT ppx - SBP goal - permissive hypertension first 24 h < 220/110. Held home meds.  - Recommend Telemetry monitoring for arrythmia - Recommend bedside swallow screen prior to PO intake. - Stroke education booklet - Recommend PT/OT/SLP consult - I also ordered STAT  ESR and CRP to rule out GCA.  _________________________________________________________________  Plan discussed with Dr. Leonette Monarch with the ED team.  This patient is critically ill and at significant risk of neurological worsening, death and care requires constant monitoring of vital signs, hemodynamics,respiratory and cardiac monitoring, neurological assessment, discussion with family, other specialists and medical decision making of high complexity. I spent 35 minutes of neurocritical care time  in the care of  this patient. This was time spent independent of any time provided by nurse  practitioner or PA.  Donnetta Simpers [facility] Pager Number [phone] 06/19/2021  2:25 AM   Thank you for the opportunity to take part in the care of this patient. If you have any further questions, please contact the neurology consultation attending.  Signed,  [place] Pager Number [phone] _ _ _   _ __   _ __ _ _  __ __   _ __   __ _

## 2021-06-19 NOTE — ED Notes (Signed)
Ambulatory to b/r, steady gait 

## 2021-06-19 NOTE — ED Provider Triage Note (Signed)
Emergency Medicine Provider Triage Evaluation Note  Ferris Tally , a 61 y.o. female  was evaluated in triage.  Pt complains of headache onset between 2000 and 2100 tonight. Started on the top of her head. Followed by bilateral tinnitus. Had an episode of complete vision loss in the right eye which lasted ~5 minutes before improving; however has residual R sided blurry vision which has persisted. Also notes subjective numbness of the R side, upper and lower extremity. She has a hx of tobacco abuse, HTN. No recent fevers or hx of trauma. Takes 81mg  ASA daily. Not chronically anticoagulated.  Review of Systems  Positive: As above Negative: As above  Physical Exam  BP (!) 159/87 (BP Location: Left Arm)    Pulse 71    Temp 98.4 F (36.9 C) (Oral)    Resp 13    SpO2 98%  Gen:   Awake, no distress   Resp:  Normal effort  MSK:   Moves extremities without difficulty  Other:  GCS 15. Speech is goal oriented without slurring. Symmetric eyebrow raise, no facial drooping, tongue midline. Patient does have a hemianopsia of the R eye with blurriness to her peripheral vision. Patient has equal grip strength bilaterally with 5/5 strength against resistance in all major muscle groups bilaterally. Sensation to light touch intact, but decreased to the R hemi face and RLE. Ambulation not assessed.  Medical Decision Making  Medically screening exam initiated at 1:13 AM.  Appropriate orders placed.  Estefanie Flatley was informed that the remainder of the evaluation will be completed by another provider, this initial triage assessment does not replace that evaluation, and the importance of remaining in the ED until their evaluation is complete.  1:23 AM Spoke with Sal of Neurology regarding symptoms, LKW between 2000 and 2100, and monocular hemianopsia of the R eye. Advises calling [facility] for expedited imaging given acute visual field defect.   2101, PA-C 06/19/21 0128

## 2023-02-18 IMAGING — MR MR HEAD W/O CM
5 of 12 series · 22 of 48 positions shown · non-contrast
Comparison: CT head, CTA head and neck earlier today.

CLINICAL DATA: 61-year-old female code stroke presentation. Right
side amaurosis fugax.

EXAM:
MRI HEAD WITHOUT CONTRAST
TECHNIQUE: Multiplanar, multiecho pulse sequences of the brain and surrounding
structures were obtained without intravenous contrast.

[Series 2: DWI · axial · 3.0mm · 0.94mm/px · z∈[-69,+87]mm · 7 of 106 slices shown (1 of 2)]
[im 1/106]
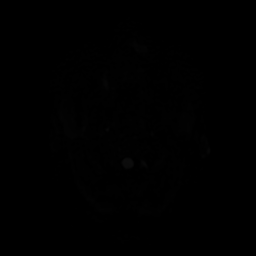
[im 18/106]
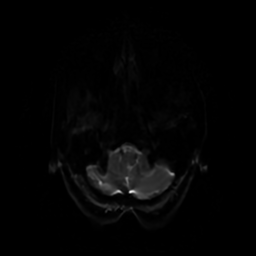
[im 36/106]
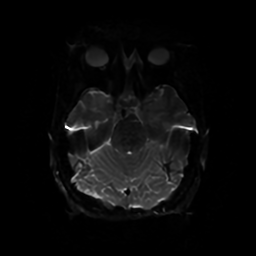
[im 53/106]
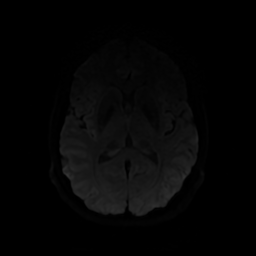
[im 71/106]
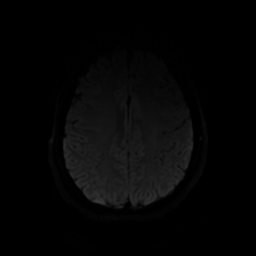
[im 88/106]
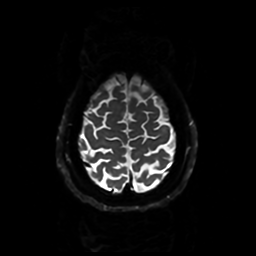
[im 106/106]
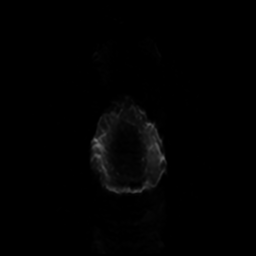

[Series 3: DWI · coronal · 4.0mm · 0.94mm/px · 5 of 76 slices shown (2 of 2)]
[im 1/76]
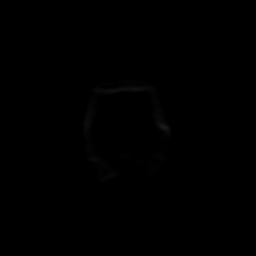
[im 16/76]
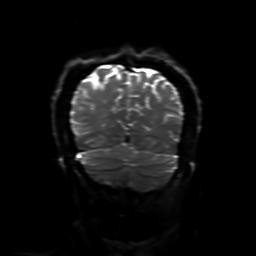
[im 31/76]
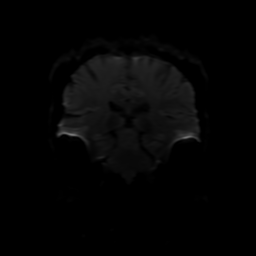
[im 46/76]
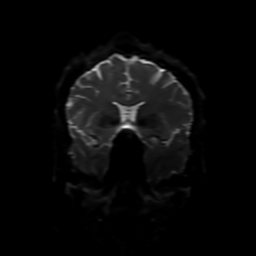
[im 61/76]
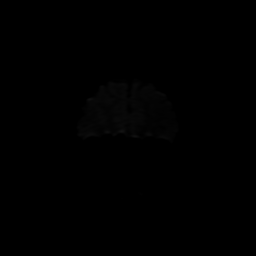

[Series 4: FLAIR · sagittal · 5.0mm · 0.23mm/px · 2 of 25 slices shown (1 of 3)]
[im 1/25]
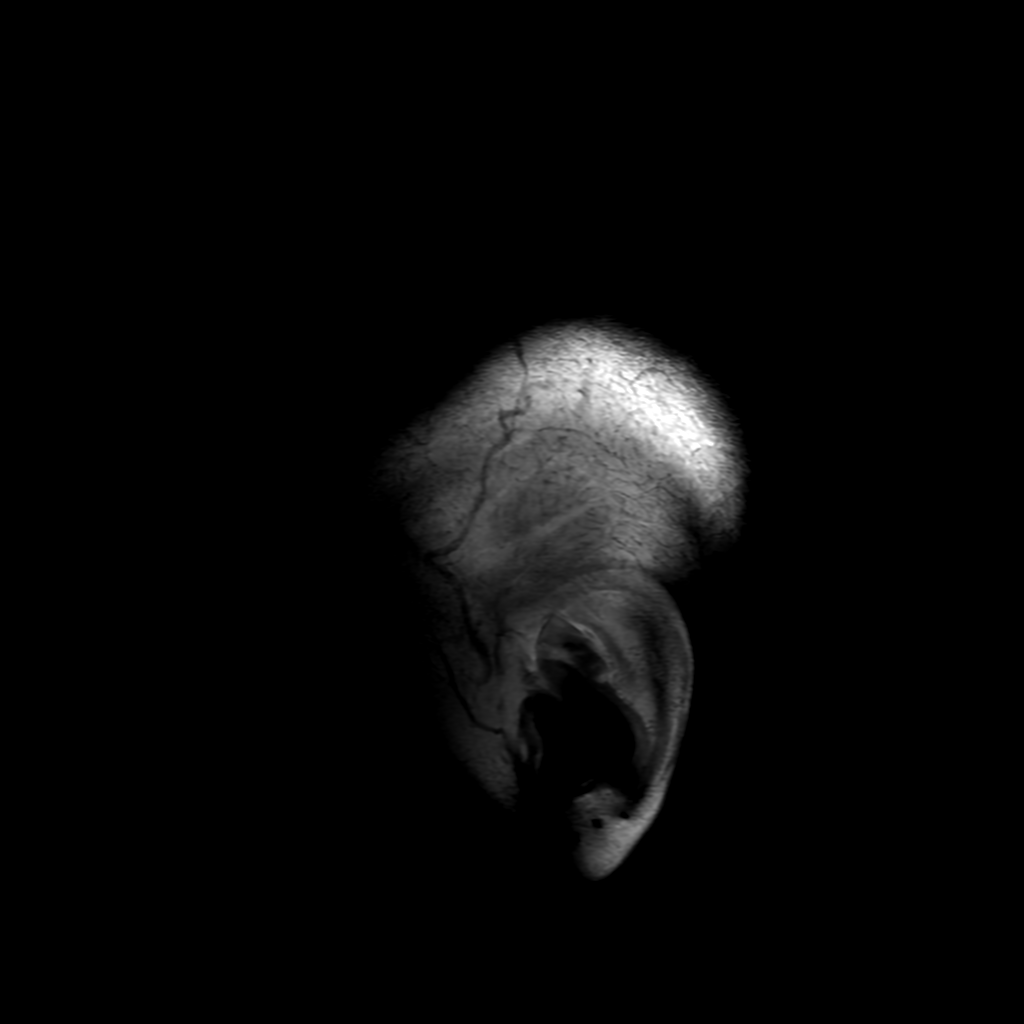
[im 25/25]
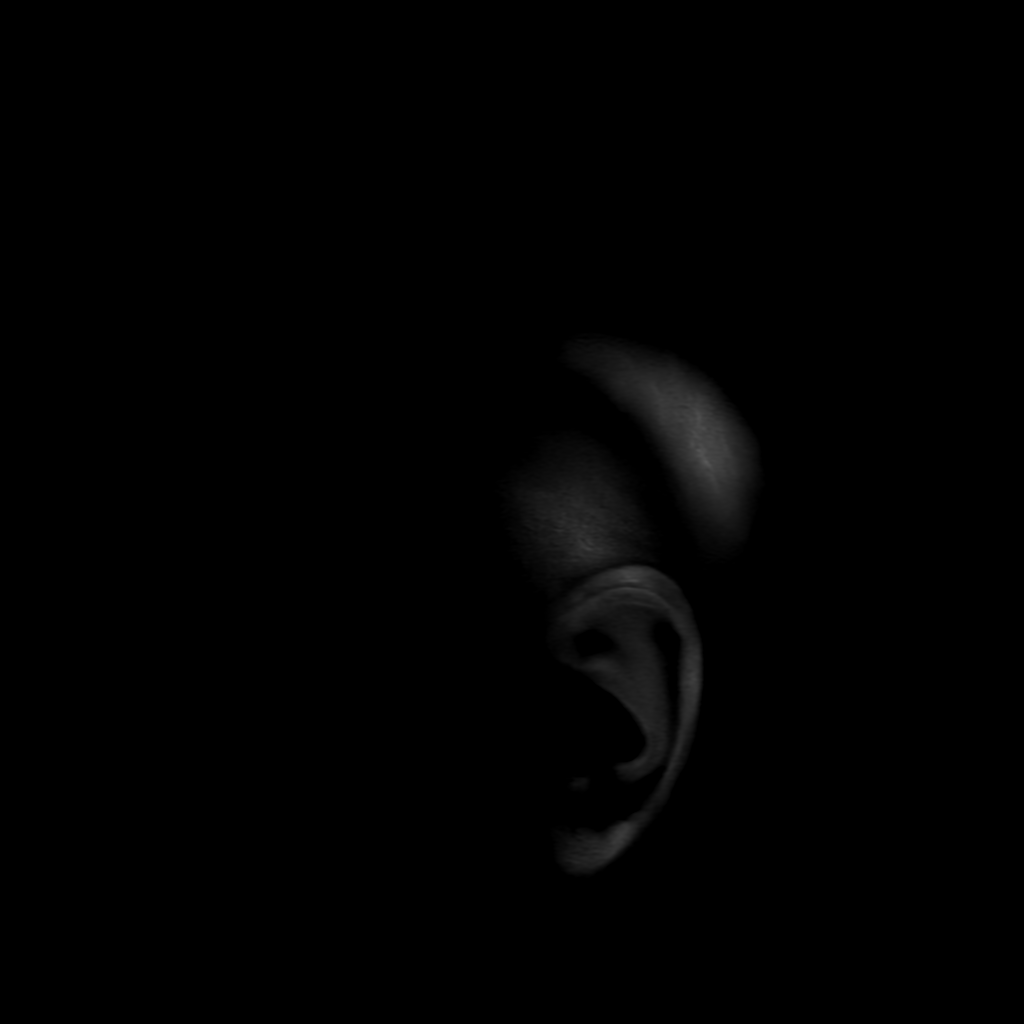

[Series 6: FLAIR · axial · 4.0mm · 0.45mm/px · z∈[-65,+85]mm · 3 of 35 slices shown (2 of 3)]
[im 1/35]
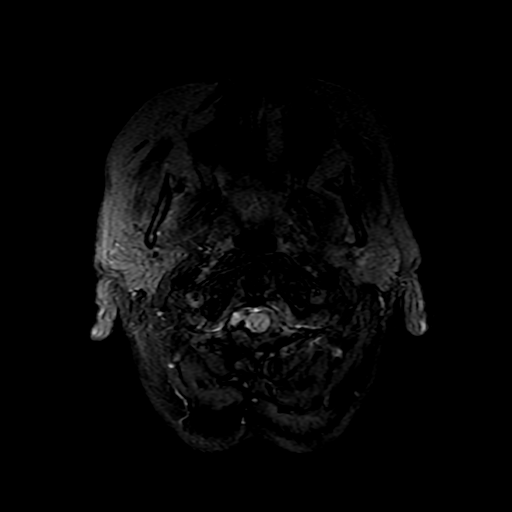
[im 18/35]
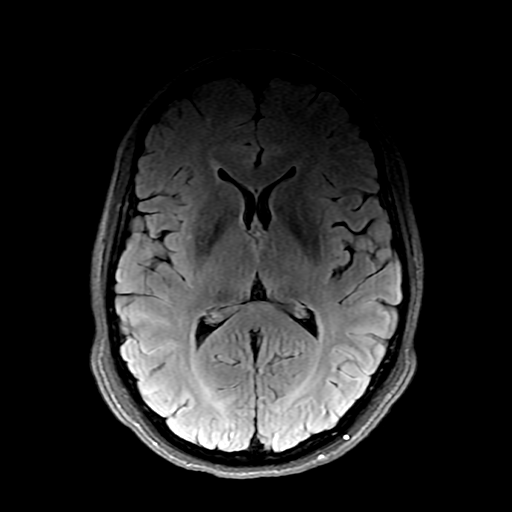
[im 35/35]
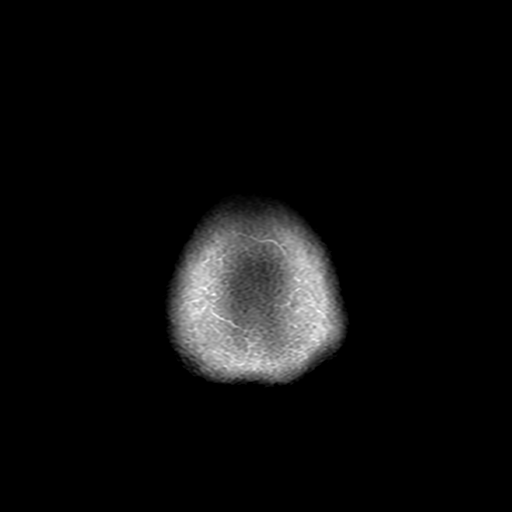

[Series 11: FLAIR · coronal · 3.0mm · 0.39mm/px · 5 of 62 slices shown (3 of 3)]
[im 1/62]
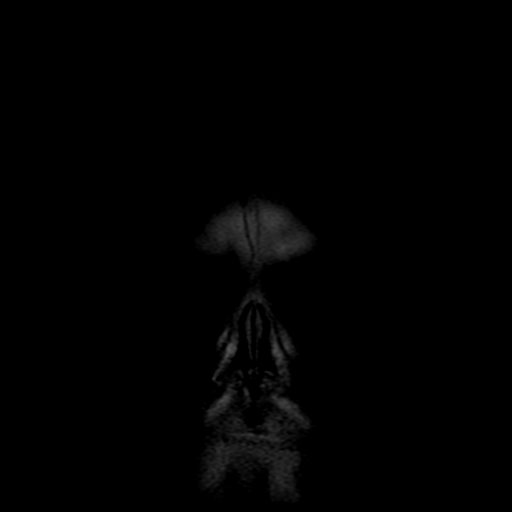
[im 16/62]
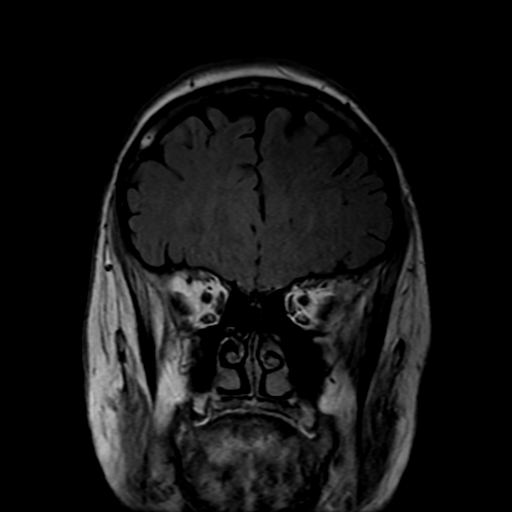
[im 31/62]
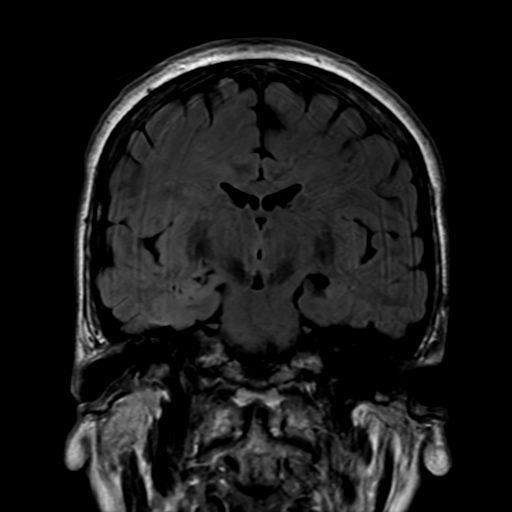
[im 46/62]
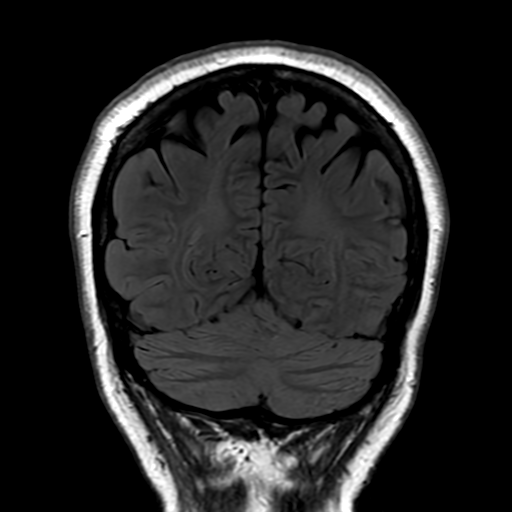
[im 62/62]
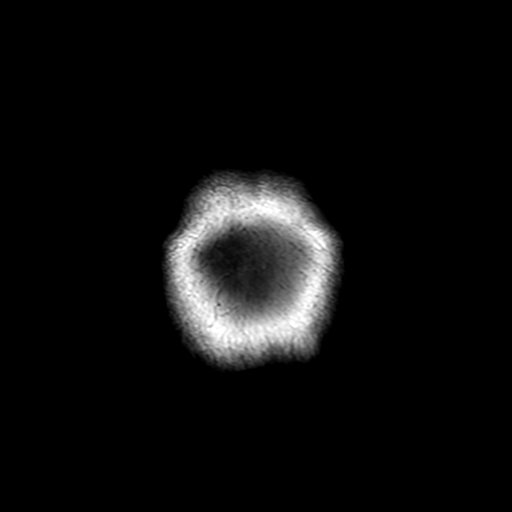

[22 of 48 positions shown; findings below may reference images not displayed]

FINDINGS: Unknown artifact leading to generalized signal loss along the
anterior head throughout this exam.

Brain: No restricted diffusion to suggest acute infarction. No
midline shift, mass effect, evidence of mass lesion,
ventriculomegaly, extra-axial collection or acute intracranial
hemorrhage. Cervicomedullary junction and pituitary are within
normal limits. Cerebral volume is within normal limits for age.

Gray and white matter signal is within normal limits for age
throughout the brain. No cortical encephalomalacia or chronic
cerebral blood products identified.

Vascular: Major intracranial vascular flow voids are preserved.

Skull and upper cervical spine: Negative visible cervical spine.
Visualized bone marrow signal is within normal limits.

Sinuses/Orbits: Suprasellar cistern and optic chiasm appear within
normal limits. Grossly symmetric and negative orbits soft tissues.
Paranasal Visualized paranasal sinuses and mastoids are stable and
well aerated.

Other: Visible internal auditory structures appear normal. Absent
dentition. Prominent but circumscribed lucent lesion of the left
maxillary alveolus as discussed on CTA earlier today is T2
hyperintense with no obvious diffusion changes.
IMPRESSION: 1. Normal for age noncontrast MRI appearance of the brain.
2. Cystic lesion of the left maxillary alveolus as detailed on CTA
today.
